# Patient Record
Sex: Male | Born: 1965 | Hispanic: Yes | Marital: Single | Smoking: Never smoker
Health system: Southern US, Community
[De-identification: ages and names within clinical notes are randomized; demographics above are authoritative.]

## PROBLEM LIST (undated history)

## (undated) DIAGNOSIS — Z9289 Personal history of other medical treatment: Secondary | ICD-10-CM

## (undated) DIAGNOSIS — E7800 Pure hypercholesterolemia, unspecified: Secondary | ICD-10-CM

---

## 1998-10-22 DIAGNOSIS — Z9289 Personal history of other medical treatment: Secondary | ICD-10-CM

## 1998-10-22 HISTORY — DX: Personal history of other medical treatment: Z92.89

## 2016-01-01 ENCOUNTER — Emergency Department
Admission: EM | Admit: 2016-01-01 | Discharge: 2016-01-01 | Disposition: A | Discharge status: Home / Self Care | Payer: Self-pay | Attending: Emergency Medicine | Admitting: Emergency Medicine

## 2016-01-01 DIAGNOSIS — B029 Zoster without complications: Secondary | ICD-10-CM | POA: Insufficient documentation

## 2016-01-01 MED ORDER — TRAMADOL HCL 50 MG PO TABS: 50.0000 mg | ORAL_TABLET | Freq: Four times a day (QID) | ORAL | Status: AC | PRN

## 2016-01-01 MED ORDER — HYDROXYZINE HCL 50 MG PO TABS: 50.0000 mg | ORAL_TABLET | Freq: Three times a day (TID) | ORAL | Status: DC | PRN

## 2016-01-01 MED ORDER — ACYCLOVIR 400 MG PO TABS: 400.0000 mg | ORAL_TABLET | Freq: Every day | ORAL | Status: DC

## 2016-01-01 NOTE — ED Provider Notes (Signed)
[facility] Emergency Department Provider Note  ____________________________________________  Time seen: Approximately 5:12 PM  I have reviewed the triage vital signs and the nursing notes.   HISTORY  Chief Complaint Rash  Via interpreter  HPI Rosana Fretndres Claros is a 50 y.o. male patient complaining a rash processes to the right abdomen and right flank. Patient state onset 2 days. Patient describes the pain as a burning sensation. Patient states some of the blisters had person crusted over. Patient states the past 2 weeks he has had fever bodyaches. Patient states she's had diarrhea and nausea but no vomiting. 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 No palliative measures for these complaints. Patient does not recall childhood immunizations . History reviewed. No pertinent past medical history.  There are no active problems to display for this patient.   History reviewed. No pertinent past surgical history.  Current Outpatient Rx  Name  Route  Sig  Dispense  Refill  . acyclovir (ZOVIRAX) 400 MG tablet   Oral   Take 1 tablet (400 mg total) by mouth 5 (five) times daily.   50 tablet   0   . hydrOXYzine (ATARAX/VISTARIL) 50 MG tablet   Oral   Take 1 tablet (50 mg total) by mouth 3 (three) times daily as needed.   30 tablet   0   . traMADol (ULTRAM) 50 MG tablet   Oral   Take 1 tablet (50 mg total) by mouth every 6 (six) hours as needed.   20 tablet   0     Allergies Review of patient's allergies indicates no known allergies.  No family history on file.  Social History Social History  Substance Use Topics  . Smoking status: Never Smoker   . Smokeless tobacco: None  . Alcohol Use: No    Review of Systems Constitutional: No fever/chills Eyes: No visual changes. ENT: No sore throat. Cardiovascular: Denies chest pain. Respiratory: Denies shortness of breath. Gastrointestinal: No abdominal pain.  No nausea, no vomiting.  No diarrhea.  No  constipation. Genitourinary: Negative for dysuria. Musculoskeletal: Negative for back pain. Skin: Positive for rash. Neurological: Negative for headaches, focal weakness or numbness.   ____________________________________________   PHYSICAL EXAM:  VITAL SIGNS: ED Triage Vitals  Enc Vitals Group     BP 01/01/16 1556 138/82 mmHg     Pulse Rate 01/01/16 1556 65     Resp 01/01/16 1556 18     Temp 01/01/16 1556 98.1 F (36.7 C)     Temp Source 01/01/16 1556 Oral     SpO2 01/01/16 1556 100 %     Weight 01/01/16 1556 160 lb (72.576 kg)     Height 01/01/16 1556 5\' 8"  (1.727 m)     Head Cir --      Peak Flow --      Pain Score 01/01/16 1556 8     Pain Loc --      Pain Edu? --      Excl. in GC? --     Constitutional: Alert and oriented. Well appearing and in no acute distress. Eyes: Conjunctivae are normal. PERRL. EOMI. Head: Atraumatic. Nose: No congestion/rhinnorhea. Mouth/Throat: Mucous membranes are moist.  Oropharynx non-erythematous. Neck: No stridor.  No cervical spine tenderness to palpation. Hematological/Lymphatic/Immunilogical: No cervical lymphadenopathy. Cardiovascular: Normal rate, regular rhythm. Grossly normal heart sounds.  Good peripheral circulation. Respiratory: Normal respiratory effort.  No retractions. Lungs CTAB. Gastrointestinal: Soft and nontender. No distention. No abdominal bruits. No CVA tenderness. Musculoskeletal: No lower  extremity tenderness nor edema.  No joint effusions. Neurologic:  Normal speech and language. No gross focal neurologic deficits are appreciated. No gait instability. Skin:  Skin is warm, dry and intact. Vesicle lesions right lateral abdomen and flank area.  Psychiatric: Mood and affect are normal. Speech and behavior are normal.  ____________________________________________   LABS (all labs ordered are listed, but only abnormal results are displayed)  Labs Reviewed - No data to  display ____________________________________________  EKG   ____________________________________________  RADIOLOGY   ____________________________________________   PROCEDURES  Procedure(s) performed: None  Critical Care performed: No  ____________________________________________   INITIAL IMPRESSION / ASSESSMENT AND PLAN / ED COURSE  Pertinent labs & imaging results that were available during my care of the patient were reviewed by me and considered in my medical decision making (see chart for details).  Herpes zoster. Patient given discharge care instructions. He is given a prescription for famciclovir, tramadol, and Atarax. [facility]. ____________________________________________   FINAL CLINICAL IMPRESSION(S) / ED DIAGNOSES  Final diagnoses:  Herpes zoster      Joni Reining, PA-C 01/01/16 1716  Emily Filbert, MD 01/01/16 1721

## 2016-01-01 NOTE — ED Notes (Addendum)
Pt arrives to ER via POV. Interpreter used for triage. Pt c/o rash/ blisters to right abdomen, right arm and right upper back X 2 days, burning in nature. Blisters have burst and crust appears on blisters. Fever and body aches X 2 weeks. Pt alert and oriented X4, active, cooperative, pt in NAD. RR even and unlabored, color WNL.

## 2016-01-01 NOTE — Discharge Instructions (Signed)
Culebrilla °(Shingles) °La culebrilla es una infección que causa una erupción dolorosa en la piel y ampollas llenas de líquido. La culebrilla está causada por el mismo virus que causa la varicela. °Solo se manifiesta en personas que: °· Tuvieron varicela. °· Recibieron la vacuna contra la varicela. (Esto es poco frecuente). °Los primeros síntomas de la culebrilla pueden ser picazón, hormigueo o dolor en una zona de la piel. Luego de unos días o semanas aparecerá una erupción cutánea. La erupción suele presentarse en un solo lado del cuerpo en un patrón con forma de cinto o de banda. Con el tiempo, la erupción se convierte en ampollas llenas de líquido que se abren, forman costras y se secan. Los medicamentos pueden tener los siguientes efectos: °· Ayudar a controlar el dolor. °· Lograr una recuperación más rápida. °· Prevenir problemas a largo plazo. °CUIDADOS EN EL HOGAR °Medicamentos  °· Tome los medicamentos solamente como se lo haya indicado el médico. °· Aplique una crema para calmar la picazón o con anestesia en la zona afectada, como se lo haya indicado el médico. °Cuidado de la erupción y las ampollas  °· Tome un baño de agua fría o aplique compresas frías en la zona de la erupción o las ampollas como se lo haya indicado el médico. Esto aliviará el dolor y la picazón. °· Mantenga la zona de la erupción cubierta con una venda (vendaje). Use ropas sueltas. °· Mantenga limpia la zona de la erupción y las ampollas con jabón suave y agua fría, o como se lo haya indicado el médico. °· Controle la erupción todos los días para detectar signos de infección. Estos signos incluyen enrojecimiento, hinchazón o dolor que perduran o empeoran. °· No pellizque las ampollas. °· No se rasque la zona de la erupción. °Instrucciones generales  °· Haga reposo tal como le indicó el médico. °· Concurra a todas las visitas de control como se lo haya indicado el médico. Esto es importante. °· Hasta tanto las ampollas formen costras,  la infección puede causar varicela en las personas que nunca la tuvieron o no se vacunaron contra la varicela. Para impedir que esto sucede, evite tocar a otras personas o estar cerca de otras personas, en especial: °¨ Bebés. °¨ Embarazadas. °¨ Niños que tienen eccema. °¨ Personas mayores que han recibido un trasplante. °¨ Personas con enfermedades crónicas, como leucemia y sida. °SOLICITE AYUDA SI: °· El dolor no mejora con medicamentos. °· El dolor no mejora después de que la erupción desaparece. °· La erupción parece infectada. Los signos de infección son los siguiente: °¨ Enrojecimiento. °¨ Hinchazón. °¨ Dolor que perdura o empeora. °SOLICITE AYUDA DE INMEDIATO SI: °· La erupción aparece en el rostro o la nariz. °· Tiene dolor en el rostro, en la zona de los ojos o pérdida de la sensibilidad en un lado del rostro. °· Siente dolor o un zumbido en el oído. °· Tiene pérdida del gusto. °· La afección empeora. °  °Esta información no tiene como fin reemplazar el consejo del médico. Asegúrese de hacerle al médico cualquier pregunta que tenga. °  °Document Released: 01/04/2009 Document Revised: 10/29/2014 °Elsevier Interactive Patient Education ©2016 [employer] ° °

## 2016-12-05 ENCOUNTER — Emergency Department
Admission: EM | Admit: 2016-12-05 | Discharge: 2016-12-06 | Disposition: A | Discharge status: Home / Self Care | Payer: Self-pay | Attending: Emergency Medicine | Admitting: Emergency Medicine

## 2016-12-05 ENCOUNTER — Emergency Department: Payer: Self-pay

## 2016-12-05 ENCOUNTER — Encounter: Payer: Self-pay | Admitting: Emergency Medicine

## 2016-12-05 DIAGNOSIS — J189 Pneumonia, unspecified organism: Secondary | ICD-10-CM | POA: Insufficient documentation

## 2016-12-05 DIAGNOSIS — J111 Influenza due to unidentified influenza virus with other respiratory manifestations: Secondary | ICD-10-CM | POA: Insufficient documentation

## 2016-12-05 MED ORDER — AZITHROMYCIN 500 MG PO TABS
500.0000 mg | ORAL_TABLET | Freq: Once | ORAL | Status: AC
  Administered 2016-12-06: 500 mg via ORAL
  Filled 2016-12-05: qty 1

## 2016-12-05 NOTE — ED Notes (Signed)
Patient transported to X-ray 

## 2016-12-05 NOTE — ED Notes (Addendum)
See triage note, pt reports cough, fever at night and chills for the last week. Pt states his cough is productive with yellow sputum. Pt states he has taken ibuprofen and tylenol with little improvement.

## 2016-12-05 NOTE — ED Provider Notes (Signed)
[facility] Emergency Department Provider Note  ____________________________________________  Time seen: Approximately 11:47 PM  I have reviewed the triage vital signs and the nursing notes.   HISTORY  Chief Complaint Fever; Chills; and Cough    HPI Gregg Leon is a 51 y.o. male presenting to the emergency department with productive cough, fever, chills, headache, diminished appetite and fatigue for the past week. Patient states that he has had purulent sputum production. Fever been as high as 104F assessed orally. Patient's children have been diagnosed with influenza last week. Patient denies chest pain, chest tightness, shortness of breath, abdominal pain, nausea and vomiting. Patient has been taking Tylenol and ibuprofen with little improvement.   History reviewed. No pertinent past medical history.  There are no active problems to display for this patient.   History reviewed. No pertinent surgical history.  Prior to Admission medications   Medication Sig Start Date End Date Taking? Authorizing Provider  acyclovir (ZOVIRAX) 400 MG tablet Take 1 tablet (400 mg total) by mouth 5 (five) times daily. 01/01/16   Joni Reining, PA-C  azithromycin (ZITHROMAX Z-PAK) 250 MG tablet Take 1 tablet daily by mouth for the next 4 days. 12/06/16 12/11/16  Orvil Feil, PA-C  hydrOXYzine (ATARAX/VISTARIL) 50 MG tablet Take 1 tablet (50 mg total) by mouth 3 (three) times daily as needed. 01/01/16   Joni Reining, PA-C  traMADol (ULTRAM) 50 MG tablet Take 1 tablet (50 mg total) by mouth every 6 (six) hours as needed. 01/01/16 12/31/16  Joni Reining, PA-C    Allergies Patient has no known allergies.  History reviewed. No pertinent family history.  Social History Social History  Substance Use Topics  . Smoking status: Never Smoker  . Smokeless tobacco: Never Used  . Alcohol use No     Review of Systems  Constitutional: Patient has had fever.  Eyes: No visual  changes. No discharge ENT: Patient has had congestion.  Cardiovascular: no chest pain. Respiratory: Patient has had productive cough.  No SOB. Gastrointestinal: Patient has had nausea.  Genitourinary: Negative for dysuria. No hematuria Musculoskeletal: Patient has had myalgias. Skin: Negative for rash, abrasions, lacerations, ecchymosis. Neurological: Patient has had headache, no focal weakness or numbness.   ____________________________________________   PHYSICAL EXAM:  VITAL SIGNS: ED Triage Vitals [12/05/16 2131]  Enc Vitals Group     BP 122/67     Pulse Rate (!) 58     Resp 15     Temp 98.2 F (36.8 C)     Temp Source Oral     SpO2 100 %     Weight 160 lb (72.6 kg)     Height      Head Circumference      Peak Flow      Pain Score      Pain Loc      Pain Edu?      Excl. in GC?      Constitutional: Alert and oriented. Patient is lying supine in bed.  Eyes: Conjunctivae are normal. PERRL. EOMI. Head: Atraumatic. ENT:      Ears: Tympanic membranes are injected bilaterally without evidence of effusion or purulent exudate. Bony landmarks are visualized bilaterally. No pain with palpation at the tragus.      Nose: Nasal turbinates are edematous and erythematous. Copious rhinorrhea visualized.      Mouth/Throat: Mucous membranes are moist. Posterior pharynx is mildly erythematous. No tonsillar hypertrophy or purulent exudate. Uvula is midline. Neck: Full range of motion.  No pain is elicited with flexion at the neck. Hematological/Lymphatic/Immunilogical: No cervical lymphadenopathy. Cardiovascular: Normal rate, regular rhythm. Normal S1 and S2.  Good peripheral circulation. Respiratory: Normal respiratory effort without tachypnea or retractions. Fine crackles are auscultated at the lung bases bilaterally. Good air entry to the bases with no decreased or absent breath sounds. Gastrointestinal: Bowel sounds 4 quadrants. Soft and nontender to palpation. No guarding or  rigidity. No palpable masses. No distention. No CVA tenderness.  Skin:  Skin is warm, dry and intact. No rash noted. Psychiatric: Mood and affect are normal. Speech and behavior are normal. Patient exhibits appropriate insight and judgement.  ____________________________________________   LABS (all labs ordered are listed, but only abnormal results are displayed)  Labs Reviewed - No data to display ____________________________________________  EKG   ____________________________________________  RADIOLOGY   Dg Chest 2 View  Result Date: 12/05/2016 CLINICAL DATA:  Acute onset of cough.  Initial encounter. EXAM: CHEST  2 VIEW COMPARISON:  None. FINDINGS: The lungs are well-aerated and clear. There is no evidence of focal opacification, pleural effusion or pneumothorax. The heart is normal in size; the mediastinal contour is within normal limits. No acute osseous abnormalities are seen. IMPRESSION: No acute cardiopulmonary process seen. Electronically Signed   By: Roanna RaiderJeffery  Chang M.D.   On: 12/05/2016 23:54    ____________________________________________    PROCEDURES  Procedure(s) performed:    Procedures    Medications  azithromycin (ZITHROMAX) tablet 500 mg (not administered)     ____________________________________________   INITIAL IMPRESSION / ASSESSMENT AND PLAN / ED COURSE  Pertinent labs & imaging results that were available during my care of the patient were reviewed by me and considered in my medical decision making (see chart for details).  Review of the [place] CSRS was performed in accordance of the [facility] prior to dispensing any controlled drugs.     Assessment and Plan:  Influenza: Community Acquired Pneumonia  Patient presents to the emergency department with headache, rhinorrhea, congestion, myalgias, chills, purulent sputum formation and fatigue. Symptoms are consistent with influenza. DG chest conducted in the emergency department indicates no  findings consistent with pneumonia. On physical exam, patient had fine crackles auscultated at the lung bases bilaterally. As patient has had purulent sputum production, I am concerned for early pneumonia. Patient was given azithromycin in the emergency department and discharged with azithromycin. Tamiflu was not prescribed at discharge as it is not therapeutically efficacious given duration of patient's symptoms. Rest and hydration were encouraged. Patient was advised to follow-up with his primary care provider in one week. Physical exam vital signs are reassuring at this time. All patient questions were answered. ____________________________________________  FINAL CLINICAL IMPRESSION(S) / ED DIAGNOSES  Final diagnoses:  Community acquired pneumonia, unspecified laterality  Influenza      NEW MEDICATIONS STARTED DURING THIS VISIT:  New Prescriptions   AZITHROMYCIN (ZITHROMAX Z-PAK) 250 MG TABLET    Take 1 tablet daily by mouth for the next 4 days.        This chart was dictated using voice recognition software/Dragon. Despite best efforts to proofread, errors can occur which can change the meaning. Any change was purely unintentional.    Orvil FeilJaclyn M Keta [place], PA-C 12/06/16 0003    Governor Rooksebecca Lord, MD 12/08/16 [phone]

## 2016-12-05 NOTE — ED Triage Notes (Signed)
Pt ambulatory to triage with steady gait, no distress noted. Pt c/o of cough, fever, chills x 1 week. Denies taking OTC medication for relief.

## 2016-12-06 MED ORDER — AZITHROMYCIN 250 MG PO TABS: ORAL_TABLET | ORAL | 0 refills | Status: AC

## 2017-07-02 ENCOUNTER — Emergency Department: Payer: Self-pay

## 2017-07-02 ENCOUNTER — Other Ambulatory Visit: Payer: Self-pay | Admitting: Radiology

## 2017-07-02 ENCOUNTER — Encounter: Payer: Self-pay | Admitting: *Deleted

## 2017-07-02 ENCOUNTER — Emergency Department
Admission: EM | Admit: 2017-07-02 | Discharge: 2017-07-02 | Disposition: A | Discharge status: Home / Self Care | Payer: Self-pay | Attending: Emergency Medicine | Admitting: Emergency Medicine

## 2017-07-02 DIAGNOSIS — G245 Blepharospasm: Secondary | ICD-10-CM | POA: Insufficient documentation

## 2017-07-02 DIAGNOSIS — R202 Paresthesia of skin: Secondary | ICD-10-CM | POA: Insufficient documentation

## 2017-07-02 DIAGNOSIS — M5412 Radiculopathy, cervical region: Secondary | ICD-10-CM | POA: Insufficient documentation

## 2017-07-02 DIAGNOSIS — M4802 Spinal stenosis, cervical region: Secondary | ICD-10-CM | POA: Insufficient documentation

## 2017-07-02 HISTORY — DX: Personal history of other medical treatment: Z92.89

## 2017-07-02 HISTORY — DX: Pure hypercholesterolemia, unspecified: E78.00

## 2017-07-02 LAB — CBC
HCT: 43.1 % (ref 40.0–52.0)
Hemoglobin: 15.5 g/dL (ref 13.0–18.0)
MCH: 31.9 pg (ref 26.0–34.0)
MCHC: 36 g/dL (ref 32.0–36.0)
MCV: 88.6 fL (ref 80.0–100.0)
PLATELETS: 172 10*3/uL (ref 150–440)
RBC: 4.87 MIL/uL (ref 4.40–5.90)
RDW: 14.2 % (ref 11.5–14.5)
WBC: 6.2 10*3/uL (ref 3.8–10.6)

## 2017-07-02 LAB — BASIC METABOLIC PANEL
Anion gap: 8 (ref 5–15)
BUN: 10 mg/dL (ref 6–20)
CO2: 26 mmol/L (ref 22–32)
CREATININE: 0.76 mg/dL (ref 0.61–1.24)
Calcium: 8.7 mg/dL — ABNORMAL LOW (ref 8.9–10.3)
Chloride: 103 mmol/L (ref 101–111)
GFR calc Af Amer: >60 mL/min (ref >60)
GLUCOSE: 123 mg/dL — AB (ref 65–99)
Potassium: 4 mmol/L (ref 3.5–5.1)
SODIUM: 137 mmol/L (ref 135–145)

## 2017-07-02 LAB — TROPONIN I: Troponin I: <0.03 ng/mL (ref <0.03)

## 2017-07-02 MED ORDER — CYCLOBENZAPRINE HCL 5 MG PO TABS: 5.0000 mg | ORAL_TABLET | Freq: Three times a day (TID) | ORAL | 0 refills | Status: DC | PRN

## 2017-07-02 MED ORDER — DEXAMETHASONE SODIUM PHOSPHATE 10 MG/ML IJ SOLN: 10.0000 mg | Freq: Once | INTRAMUSCULAR | Status: DC

## 2017-07-02 MED ORDER — PREDNISONE 20 MG PO TABS: 60.0000 mg | ORAL_TABLET | Freq: Every day | ORAL | 0 refills | Status: DC

## 2017-07-02 MED ORDER — DEXAMETHASONE SODIUM PHOSPHATE 10 MG/ML IJ SOLN
10.0000 mg | Freq: Once | INTRAMUSCULAR | Status: AC
  Administered 2017-07-02: 10 mg

## 2017-07-02 MED ORDER — DEXAMETHASONE 1 MG/ML PO CONC
10.0000 mg | Freq: Once | ORAL | Status: DC
  Filled 2017-07-02: qty 10

## 2017-07-02 MED ORDER — DEXAMETHASONE SODIUM PHOSPHATE 10 MG/ML IJ SOLN
INTRAMUSCULAR | Status: AC
  Filled 2017-07-02: qty 1

## 2017-07-02 NOTE — ED Notes (Signed)
Water given to pt 

## 2017-07-02 NOTE — ED Notes (Signed)
Spoke with MRI staff.  They report it will be about 1 more hour until they will be able to get to him.

## 2017-07-02 NOTE — ED Notes (Signed)
Monitors removed and pt undressed completely and placed in hospital gown; pt to MRI via stretcher accomp by MRI tech and EDT, Rayford HalstedJuanetta

## 2017-07-02 NOTE — ED Triage Notes (Signed)
Pt to ED reporting chest pains and weakness for the past 2 days that worsened last night and began radiating into left side of neck. Pt also reports having numbness in left arm for the past 2 days that worsened today. Pt reports feeling a tingling and numbness "all over." No neuro deficits noted at this time and no increased weakness noted to left side upon assessment. Pt is alert and oriented x 4 and denies heart hx.   Pt also reports having intermittent blurred vision in left eye yesterday but denies currently having changes in vision.

## 2017-07-02 NOTE — ED Notes (Signed)
Pt returns to room; no c/o voiced

## 2017-07-02 NOTE — ED Provider Notes (Addendum)
Marland Kitchen.[facility] Emergency Department Provider Note  ____________________________________________   I have reviewed the triage vital signs and the nursing notes.   HISTORY  Chief Complaint Chest Pain and Numbness    HPI Gregg Leon is a 51 y.o. male Presents today complaining of tingling in his arm and leg and neck pain which goes up towards his ear and down his trapezius muscle for 2 weeks. Worse when he sleeps. To me, he denies chest pain. He has no exertional symptoms he has no shortness of breath. He denies weakness but he does have progressive tingling. He does not recall any recent trauma to his head or neck, he denies any right-sided symptoms. He has had no change in vision but sometimes he has a twitch around his eye sounds like. Patient denies any fever or chills. Has had normal bowel movements. He is speaking clearly, he has no trouble talking or walking. Most of the time this irritates and when he tried to sleep.    Past Medical History:  Diagnosis Date  . High cholesterol   . History of blood transfusion 2000    There are no active problems to display for this patient.   History reviewed. No pertinent surgical history.  Prior to Admission medications   Medication Sig Start Date End Date Taking? Authorizing Provider  acyclovir (ZOVIRAX) 400 MG tablet Take 1 tablet (400 mg total) by mouth 5 (five) times daily. Patient not taking: Reported on 07/02/2017 01/01/16   Joni ReiningSmith, Ronald K, PA-C  hydrOXYzine (ATARAX/VISTARIL) 50 MG tablet Take 1 tablet (50 mg total) by mouth 3 (three) times daily as needed. Patient not taking: Reported on 07/02/2017 01/01/16   Joni ReiningSmith, Ronald K, PA-C    Allergies Patient has no known allergies.  History reviewed. No pertinent family history.  Social History Social History  Substance Use Topics  . Smoking status: Never Smoker  . Smokeless tobacco: Never Used  . Alcohol use No    Review of  Systems Constitutional: No fever/chills Eyes: No visual changes. ENT: No sore throat. No stiff neck no neck pain Cardiovascular: Denies chest pain. Respiratory: Denies shortness of breath. Gastrointestinal:   no vomiting.  No diarrhea.  No constipation. Genitourinary: Negative for dysuria. Musculoskeletal: Negative lower extremity swelling Skin: Negative for rash. Neurological: Negative for severe headaches, focal weakness or numbness.   ____________________________________________   PHYSICAL EXAM:  VITAL SIGNS: ED Triage Vitals  Enc Vitals Group     BP 07/02/17 1356 (!) 142/78     Pulse Rate 07/02/17 1356 64     Resp 07/02/17 1356 16     Temp 07/02/17 1356 98 F (36.7 C)     Temp Source 07/02/17 1356 Oral     SpO2 07/02/17 1356 99 %     Weight 07/02/17 1328 162 lb (73.5 kg)     Height --      Head Circumference --      Peak Flow --      Pain Score 07/02/17 1327 8     Pain Loc --      Pain Edu? --      Excl. in GC? --     Constitutional: Alert and oriented. Well appearing and in no acute distress. Eyes: Conjunctivae are normal Head: Atraumatic HEENT: No congestion/rhinnorhea. Mucous membranes are moist.  Oropharynx non-erythematous Neck:   Nontender no centralized tenderness however there is tenderness to palpation over the trapezius muscle when I squeeze this area patient states "ouch that's the pain  right there Cardiovascular: Normal rate, regular rhythm. Grossly normal heart sounds.  Good peripheral circulation. Respiratory: Normal respiratory effort.  No retractions. Lungs CTAB. Abdominal: Soft and nontender. No distention. No guarding no rebound Back:  There is no focal tenderness or step off.  there is no midline tenderness there are no lesions noted. there is no CVA tenderness Musculoskeletal: No lower extremity tenderness, no upper extremity tenderness. No joint effusions, no DVT signs strong distal pulses no edema Neurologic:  Normal speech and language. No  gross focal neurologic deficits are appreciated. patient has subjective tingling to the left upper and left lower shortly but sensation is intact otherwise completely normal neurologic exam,Cranial nerves II through XII are grossly intact 5 out of 5 strength bilateral upper and lower extremity. Finger to nose within normal limits heel to shin within normal limits, speech is normal with no word finding difficulty or dysarthria, reflexes symmetric, pupils are equally round and reactive to light, there is no pronator drift, sensation is normal, vision is intact to confrontation, gait is deferred, there is no nystagmus, normal neurologic exam Skin:  Skin is warm, dry and intact. No rash noted. Psychiatric: Mood and affect are normal. Speech and behavior are normal.  ____________________________________________   LABS (all labs ordered are listed, but only abnormal results are displayed)  Labs Reviewed  BASIC METABOLIC PANEL - Abnormal; Notable for the following:       Result Value   Glucose, Bld 123 (*)    Calcium 8.7 (*)    All other components within normal limits  CBC  TROPONIN I   ____________________________________________  EKG  I personally interpreted any EKGs ordered by me or triage normal sinus rhythm, rate 61 beats minute no acute ischemic changes unremarkable EKG ____________________________________________  RADIOLOGY  I reviewed any imaging ordered by me or triage that were performed during my shift and, if possible, patient and/or family made aware of any abnormal findings. ____________________________________________   PROCEDURES  Procedure(s) performed: None  Procedures  Critical Care performed: None  ____________________________________________   INITIAL IMPRESSION / ASSESSMENT AND PLAN / ED COURSE  Pertinent labs & imaging results that were available during my care of the patient were reviewed by me and considered in my medical decision making (see chart for  details).  SHEENT here with tingling in his left upper and left lower extremity is worse at night, which is progressive as well as neck pain. Concern for radiculopathy of the cervical region is most likely. I will get imaging to verify no other acute pathology, neurologically he is intact with likely has poor follow-up. Certainly at December low suspicion for CVA by given the distribution of symptoms we'll also obtain an MRI of his head and I will continue to evaluate him. Very low suspicion for ACS PE or dissection.  ----------------------------------------- 10:04 PM on 07/02/2017 -----------------------------------------  extensively explained all of her findings with the help of a Spanish interpreter and my Spanish. Patient understands return precautions and follow-up, he also understands the need to look out for signs and symptoms of cauda equina room etc., and this dictation, he is informed that he must not drive on [place] etc. Patient understands all these instructions and all of his questions were answered.   ____________________________________________   FINAL CLINICAL IMPRESSION(S) / ED DIAGNOSES  Final diagnoses:  None      This chart was dictated using voice recognition software.  Despite best efforts to proofread,  errors can occur which can change meaning.  Jeanmarie Plant, MD 07/02/17 1646    Jeanmarie Plant, MD 07/02/17 2205

## 2017-07-02 NOTE — ED Notes (Addendum)
MRI tech at bedside to review procedure and checklist [facility], [place]; pt voices good understanding of procedure to be performed; pt denies any c/o at present with no CP; pt voices understanding of repeat troponin to be performed as well

## 2017-07-02 NOTE — ED Notes (Signed)
interpreter arrived to triage

## 2020-08-27 ENCOUNTER — Emergency Department: Payer: Medicaid Other

## 2020-08-27 ENCOUNTER — Inpatient Hospital Stay
Admission: EM | Admit: 2020-08-27 | Discharge: 2020-08-31 | DRG: 177 | Disposition: A | Discharge status: Home / Self Care | Payer: Medicaid Other | Attending: Hospitalist | Admitting: Hospitalist

## 2020-08-27 DIAGNOSIS — R0602 Shortness of breath: Secondary | ICD-10-CM | POA: Diagnosis present

## 2020-08-27 DIAGNOSIS — R748 Abnormal levels of other serum enzymes: Secondary | ICD-10-CM | POA: Diagnosis not present

## 2020-08-27 DIAGNOSIS — T380X5A Adverse effect of glucocorticoids and synthetic analogues, initial encounter: Secondary | ICD-10-CM | POA: Diagnosis not present

## 2020-08-27 DIAGNOSIS — K219 Gastro-esophageal reflux disease without esophagitis: Secondary | ICD-10-CM | POA: Diagnosis present

## 2020-08-27 DIAGNOSIS — Z79899 Other long term (current) drug therapy: Secondary | ICD-10-CM | POA: Diagnosis not present

## 2020-08-27 DIAGNOSIS — D6959 Other secondary thrombocytopenia: Secondary | ICD-10-CM | POA: Diagnosis present

## 2020-08-27 DIAGNOSIS — E876 Hypokalemia: Secondary | ICD-10-CM | POA: Diagnosis present

## 2020-08-27 DIAGNOSIS — E785 Hyperlipidemia, unspecified: Secondary | ICD-10-CM | POA: Diagnosis present

## 2020-08-27 DIAGNOSIS — E7800 Pure hypercholesterolemia, unspecified: Secondary | ICD-10-CM | POA: Diagnosis present

## 2020-08-27 DIAGNOSIS — D696 Thrombocytopenia, unspecified: Secondary | ICD-10-CM

## 2020-08-27 DIAGNOSIS — D72818 Other decreased white blood cell count: Secondary | ICD-10-CM | POA: Diagnosis not present

## 2020-08-27 DIAGNOSIS — J1282 Pneumonia due to coronavirus disease 2019: Secondary | ICD-10-CM | POA: Diagnosis not present

## 2020-08-27 DIAGNOSIS — R0902 Hypoxemia: Secondary | ICD-10-CM

## 2020-08-27 DIAGNOSIS — U071 COVID-19: Principal | ICD-10-CM | POA: Diagnosis present

## 2020-08-27 DIAGNOSIS — Y929 Unspecified place or not applicable: Secondary | ICD-10-CM | POA: Diagnosis not present

## 2020-08-27 DIAGNOSIS — R739 Hyperglycemia, unspecified: Secondary | ICD-10-CM | POA: Diagnosis not present

## 2020-08-27 DIAGNOSIS — J9601 Acute respiratory failure with hypoxia: Secondary | ICD-10-CM | POA: Diagnosis not present

## 2020-08-27 DIAGNOSIS — R7303 Prediabetes: Secondary | ICD-10-CM | POA: Diagnosis present

## 2020-08-27 DIAGNOSIS — J9811 Atelectasis: Secondary | ICD-10-CM | POA: Diagnosis present

## 2020-08-27 DIAGNOSIS — D72819 Decreased white blood cell count, unspecified: Secondary | ICD-10-CM

## 2020-08-27 DIAGNOSIS — T50905A Adverse effect of unspecified drugs, medicaments and biological substances, initial encounter: Secondary | ICD-10-CM

## 2020-08-27 LAB — RESPIRATORY PANEL BY RT PCR (FLU A&B, COVID)
Influenza A by PCR: NEGATIVE
Influenza B by PCR: NEGATIVE
SARS Coronavirus 2 by RT PCR: POSITIVE — AB

## 2020-08-27 LAB — COMPREHENSIVE METABOLIC PANEL
ALT: 122 U/L — ABNORMAL HIGH (ref 0–44)
AST: 121 U/L — ABNORMAL HIGH (ref 15–41)
Albumin: 3.5 g/dL (ref 3.5–5.0)
Alkaline Phosphatase: 151 U/L — ABNORMAL HIGH (ref 38–126)
Anion gap: 12 (ref 5–15)
BUN: 13 mg/dL (ref 6–20)
CO2: 24 mmol/L (ref 22–32)
Calcium: 7.9 mg/dL — ABNORMAL LOW (ref 8.9–10.3)
Chloride: 100 mmol/L (ref 98–111)
Creatinine, Ser: 0.92 mg/dL (ref 0.61–1.24)
GFR, Estimated: >60 mL/min (ref >60)
Glucose, Bld: 196 mg/dL — ABNORMAL HIGH (ref 70–99)
Potassium: 3.4 mmol/L — ABNORMAL LOW (ref 3.5–5.1)
Sodium: 136 mmol/L (ref 135–145)
Total Bilirubin: 1 mg/dL (ref 0.3–1.2)
Total Protein: 7.4 g/dL (ref 6.5–8.1)

## 2020-08-27 LAB — CBC WITH DIFFERENTIAL/PLATELET
Abs Immature Granulocytes: 0.03 10*3/uL (ref 0.00–0.07)
Basophils Absolute: 0 10*3/uL (ref 0.0–0.1)
Basophils Relative: 0 %
Eosinophils Absolute: 0 10*3/uL (ref 0.0–0.5)
Eosinophils Relative: 0 %
HCT: 43.3 % (ref 39.0–52.0)
Hemoglobin: 14.9 g/dL (ref 13.0–17.0)
Immature Granulocytes: 1 %
Lymphocytes Relative: 15 %
Lymphs Abs: 0.8 10*3/uL (ref 0.7–4.0)
MCH: 30.2 pg (ref 26.0–34.0)
MCHC: 34.4 g/dL (ref 30.0–36.0)
MCV: 87.8 fL (ref 80.0–100.0)
Monocytes Absolute: 0.3 10*3/uL (ref 0.1–1.0)
Monocytes Relative: 6 %
Neutro Abs: 4.2 10*3/uL (ref 1.7–7.7)
Neutrophils Relative %: 78 %
Platelets: 153 10*3/uL (ref 150–400)
RBC: 4.93 MIL/uL (ref 4.22–5.81)
RDW: 13.2 % (ref 11.5–15.5)
WBC: 5.4 10*3/uL (ref 4.0–10.5)
nRBC: 0 % (ref 0.0–0.2)

## 2020-08-27 LAB — LACTIC ACID, PLASMA: Lactic Acid, Venous: 1.6 mmol/L (ref 0.5–1.9)

## 2020-08-27 LAB — TROPONIN I (HIGH SENSITIVITY): Troponin I (High Sensitivity): 17 ng/L (ref <18)

## 2020-08-27 MED ORDER — ZINC SULFATE 220 (50 ZN) MG PO CAPS
220.0000 mg | ORAL_CAPSULE | Freq: Every day | ORAL | Status: DC
  Administered 2020-08-28 – 2020-08-31 (×4): 220 mg via ORAL
  Filled 2020-08-27 (×4): qty 1

## 2020-08-27 MED ORDER — ONDANSETRON HCL 4 MG PO TABS: 4.0000 mg | ORAL_TABLET | Freq: Four times a day (QID) | ORAL | Status: DC | PRN

## 2020-08-27 MED ORDER — ENOXAPARIN SODIUM 40 MG/0.4ML ~~LOC~~ SOLN
40.0000 mg | SUBCUTANEOUS | Status: DC
  Administered 2020-08-28 – 2020-08-29 (×2): 40 mg via SUBCUTANEOUS
  Filled 2020-08-27 (×2): qty 0.4

## 2020-08-27 MED ORDER — ASCORBIC ACID 500 MG PO TABS
500.0000 mg | ORAL_TABLET | Freq: Every day | ORAL | Status: DC
  Administered 2020-08-28 – 2020-08-31 (×4): 500 mg via ORAL
  Filled 2020-08-27 (×4): qty 1

## 2020-08-27 MED ORDER — GUAIFENESIN-DM 100-10 MG/5ML PO SYRP: 10.0000 mL | ORAL_SOLUTION | ORAL | Status: DC | PRN

## 2020-08-27 MED ORDER — FAMOTIDINE 20 MG PO TABS
20.0000 mg | ORAL_TABLET | Freq: Two times a day (BID) | ORAL | Status: DC
  Administered 2020-08-28 – 2020-08-31 (×7): 20 mg via ORAL
  Filled 2020-08-27 (×7): qty 1

## 2020-08-27 MED ORDER — LACTATED RINGERS IV BOLUS
1000.0000 mL | Freq: Once | INTRAVENOUS | Status: AC
  Administered 2020-08-27: 1000 mL via INTRAVENOUS

## 2020-08-27 MED ORDER — ASPIRIN EC 81 MG PO TBEC
81.0000 mg | DELAYED_RELEASE_TABLET | Freq: Every day | ORAL | Status: DC
  Administered 2020-08-28 – 2020-08-31 (×4): 81 mg via ORAL
  Filled 2020-08-27 (×4): qty 1

## 2020-08-27 MED ORDER — TRAZODONE HCL 50 MG PO TABS
25.0000 mg | ORAL_TABLET | Freq: Every evening | ORAL | Status: DC | PRN
  Administered 2020-08-28: 25 mg via ORAL
  Filled 2020-08-27: qty 1

## 2020-08-27 MED ORDER — MAGNESIUM HYDROXIDE 400 MG/5ML PO SUSP
30.0000 mL | Freq: Every day | ORAL | Status: DC | PRN
  Filled 2020-08-27: qty 30

## 2020-08-27 MED ORDER — ONDANSETRON HCL 4 MG/2ML IJ SOLN: 4.0000 mg | Freq: Four times a day (QID) | INTRAMUSCULAR | Status: DC | PRN

## 2020-08-27 MED ORDER — ACETAMINOPHEN 325 MG PO TABS: 650.0000 mg | ORAL_TABLET | Freq: Four times a day (QID) | ORAL | Status: DC | PRN

## 2020-08-27 MED ORDER — PREDNISONE 20 MG PO TABS
50.0000 mg | ORAL_TABLET | Freq: Every day | ORAL | Status: DC
  Administered 2020-08-31: 09:00:00 50 mg via ORAL
  Filled 2020-08-27: qty 1

## 2020-08-27 MED ORDER — SODIUM CHLORIDE 0.9 % IV SOLN
200.0000 mg | Freq: Once | INTRAVENOUS | Status: AC
  Administered 2020-08-28: 02:00:00 200 mg via INTRAVENOUS
  Filled 2020-08-27: qty 200

## 2020-08-27 MED ORDER — DEXAMETHASONE SODIUM PHOSPHATE 10 MG/ML IJ SOLN
6.0000 mg | Freq: Once | INTRAMUSCULAR | Status: AC
  Administered 2020-08-27: 6 mg via INTRAVENOUS

## 2020-08-27 MED ORDER — HYDROCOD POLST-CPM POLST ER 10-8 MG/5ML PO SUER
5.0000 mL | Freq: Two times a day (BID) | ORAL | Status: DC | PRN
  Administered 2020-08-29: 5 mL via ORAL
  Filled 2020-08-27: qty 5

## 2020-08-27 MED ORDER — GUAIFENESIN ER 600 MG PO TB12
600.0000 mg | ORAL_TABLET | Freq: Two times a day (BID) | ORAL | Status: DC
  Administered 2020-08-28 – 2020-08-31 (×8): 600 mg via ORAL
  Filled 2020-08-27 (×8): qty 1

## 2020-08-27 MED ORDER — METHYLPREDNISOLONE SODIUM SUCC 125 MG IJ SOLR
1.0000 mg/kg | Freq: Two times a day (BID) | INTRAMUSCULAR | Status: AC
  Administered 2020-08-28 – 2020-08-30 (×6): 78.125 mg via INTRAVENOUS
  Filled 2020-08-27 (×6): qty 2

## 2020-08-27 MED ORDER — VITAMIN D 25 MCG (1000 UNIT) PO TABS
1000.0000 [IU] | ORAL_TABLET | Freq: Every day | ORAL | Status: DC
  Administered 2020-08-28 – 2020-08-31 (×4): 1000 [IU] via ORAL
  Filled 2020-08-27 (×4): qty 1

## 2020-08-27 MED ORDER — SODIUM CHLORIDE 0.9 % IV SOLN: INTRAVENOUS | Status: DC

## 2020-08-27 MED ORDER — ACETAMINOPHEN 500 MG PO TABS
1000.0000 mg | ORAL_TABLET | Freq: Once | ORAL | Status: AC
  Administered 2020-08-27: 1000 mg via ORAL

## 2020-08-27 MED ORDER — SODIUM CHLORIDE 0.9 % IV SOLN
100.0000 mg | Freq: Every day | INTRAVENOUS | Status: AC
  Administered 2020-08-28 – 2020-08-31 (×4): 100 mg via INTRAVENOUS
  Filled 2020-08-27 (×5): qty 20

## 2020-08-27 MED ORDER — BARICITINIB 2 MG PO TABS
4.0000 mg | ORAL_TABLET | Freq: Every day | ORAL | Status: DC
  Administered 2020-08-28 – 2020-08-31 (×4): 4 mg via ORAL
  Filled 2020-08-27 (×4): qty 2

## 2020-08-27 NOTE — ED Notes (Signed)
Date and time results received: 08/27/20 2224 (use smartphrase ".now" to insert current time)  Test: Covid-19 Critical Value: Covid-19 Positive  Name of Provider Notified: Dr. Katrinka Blazing  Orders Received? Or Actions Taken?: provider notified

## 2020-08-27 NOTE — Progress Notes (Signed)
Remdesivir - Pharmacy Brief Note   O:  ALT: 122 CXR:  SpO2: 96 % on 4L [place]    A/P:  Remdesivir 200 mg IVPB once followed by 100 mg IVPB daily x 4 days.   Avraj Lindroth D 08/27/2020 11:13 PM

## 2020-08-27 NOTE — ED Notes (Signed)
Hospital translator used. Pt came in saying  Not feeling well for a week, with nausea, vomiting. Pt states several days without a bowel movement. Pt states fever and body aches. Pt placed on cardiac, bp and pulse ox monitor. Side rails up. Pt on 4lpm via [place] of oxygen.

## 2020-08-27 NOTE — H&P (Addendum)
[place]   PATIENT NAME: Gregg Leon    MR#:  [id number]  DATE OF BIRTH:  02/19/1976  DATE OF ADMISSION:  08/27/2020  PRIMARY CARE PHYSICIAN: Patient, No Pcp Per   REQUESTING/REFERRING PHYSICIAN: Vladimir Crofts, MD CHIEF COMPLAINT:  Shortness of breath.  HISTORY OF PRESENT ILLNESS:  Gregg Leon  is a Hispanic 54 y.o. male with a known history of dyslipidemia, who presented to the emergency room with acute onset of worsening dyspnea as well as mainly dry and occasionally productive cough without wheezing and intermittent fever and chills over the last week.  Admits to myalgia and denies any nausea or vomiting or diarrhea.  No loss of taste or smell.  He does not recall any specific COVID-19 exposure.  He has not been vaccinated for COVID-19.  Upon presentation to the emergency room, she was 103.1 with heart rate of 106 and respirate of 28 and pulse currently 84% on room air that was up to 96% on 4 L of O2 by nasal cannula.  Labs reviewed mild hypokalemia at 3.4 with a blood glucose of 196 and alk phos 131, AST 121 ALT 122 and lactic acid was 1.6.  CBC was unremarkable.  COVID-19 PCR came back positive and influenza antigens were negative.  Blood culture was drawn.  Portable chest ray showed mild increased interstitial markings in both lung bases that may be due to atelectasis and/or infectious etiology as well as pulmonary vascular congestion. EKG showed sinus rhythm rate 99 with LVH.  The patient was given 1 g p.o. Tylenol and 6 mg of IV Decadron as well as 1 L bolus of IV lactated Ringer.  He will be admitted to a medical monitored bed for further evaluation and management  PAST MEDICAL HISTORY:   Past Medical History:  Diagnosis Date  . High cholesterol   . History of blood transfusion 2000    PAST SURGICAL HISTORY:  No past surgical history on file.  He denies any previous surgeries.  SOCIAL HISTORY:   Social History   Tobacco Use  . Smoking status:  Never Smoker  . Smokeless tobacco: Never Used  Substance Use Topics  . Alcohol use: No  No history of tobacco or EtOH abuse or illicit drug use.  FAMILY HISTORY:  He denies any familial diseases.  DRUG ALLERGIES:  No Known Allergies  REVIEW OF SYSTEMS:   ROS As per history of present illness. All pertinent systems were reviewed above. Constitutional, HEENT, cardiovascular, respiratory, GI, GU, musculoskeletal, neuro, psychiatric, endocrine, integumentary and hematologic systems were reviewed and are otherwise negative/unremarkable except for positive findings mentioned above in the HPI.   MEDICATIONS AT HOME:   Prior to Admission medications   Medication Sig Start Date End Date Taking? Authorizing Provider  acyclovir (ZOVIRAX) 400 MG tablet Take 1 tablet (400 mg total) by mouth 5 (five) times daily. Patient not taking: Reported on 07/02/2017 01/01/16   Sable Feil, PA-C  cyclobenzaprine (FLEXERIL) 5 MG tablet Take 1 tablet (5 mg total) by mouth 3 (three) times daily as needed for muscle spasms. 07/02/17   Schuyler Amor, MD  hydrOXYzine (ATARAX/VISTARIL) 50 MG tablet Take 1 tablet (50 mg total) by mouth 3 (three) times daily as needed. Patient not taking: Reported on 07/02/2017 01/01/16   Sable Feil, PA-C  predniSONE (DELTASONE) 20 MG tablet Take 3 tablets (60 mg total) by mouth daily. 07/02/17   Schuyler Amor, MD      VITAL SIGNS:  Blood pressure 126/80, pulse 80, temperature (!) 102.4 F (39.1 C), resp. rate (!) 36, weight 78 kg, SpO2 96 %.  PHYSICAL EXAMINATION:  Physical Exam  GENERAL:  54 y.o.-year-old Hispanic male patient lying in the bed with mild respiratory distress with conversational dyspnea. EYES: Pupils equal, round, reactive to light and accommodation. No scleral icterus. Extraocular muscles intact.  HEENT: Head atraumatic, normocephalic. Oropharynx and nasopharynx clear.  NECK:  Supple, no jugular venous distention. No thyroid enlargement, no  tenderness.  LUNGS: Diminished bibasal breath sounds with bibasal crackles. CARDIOVASCULAR: Regular rate and rhythm, S1, S2 normal. No murmurs, rubs, or gallops.  ABDOMEN: Soft, nondistended, nontender. Bowel sounds present. No organomegaly or mass.  EXTREMITIES: No pedal edema, cyanosis, or clubbing.  NEUROLOGIC: Cranial nerves II through XII are intact. Muscle strength 5/5 in all extremities. Sensation intact. Gait not checked.  PSYCHIATRIC: The patient is alert and oriented x 3.  Normal affect and good eye contact. SKIN: No obvious rash, lesion, or ulcer.   LABORATORY PANEL:   CBC Recent Labs  Lab 08/27/20 2118  WBC 5.4  HGB 14.9  HCT 43.3  PLT 153   ------------------------------------------------------------------------------------------------------------------  Chemistries  Recent Labs  Lab 08/27/20 2118  NA 136  K 3.4*  CL 100  CO2 24  GLUCOSE 196*  BUN 13  CREATININE 0.92  CALCIUM 7.9*  AST 121*  ALT 122*  ALKPHOS 151*  BILITOT 1.0   ------------------------------------------------------------------------------------------------------------------  Cardiac Enzymes No results for input(s): TROPONINI in the last 168 hours. ------------------------------------------------------------------------------------------------------------------  RADIOLOGY:  DG Chest Portable 1 View  Result Date: 08/27/2020 CLINICAL DATA:  Hypoxic EXAM: PORTABLE CHEST 1 VIEW COMPARISON:  None. FINDINGS: The heart size and mediastinal contours are within normal limits. There is prominence the central pulmonary vasculature. Increased interstitial markings are seen at both lung bases. The visualized skeletal structures are unremarkable. IMPRESSION: Pulmonary vascular congestion. Mildly increased interstitial markings of both lung bases which may be due to atelectasis and/or infectious etiology. Electronically Signed   By: Prudencio Pair M.D.   On: 08/27/2020 21:51      IMPRESSION AND PLAN:     1.  Acute hypoxemic respiratory failure secondary to COVID-19. -The patient will be admitted to a medically monitored isolation bed. -O2 protocol will be followed to keep O2 saturation above 93.   2.  Multifocal pneumonia secondary to COVID-19. -The patient will be admitted to an isolation monitored bed with droplet and contact precautions. -Given multifocal pneumonia we will empirically place the patient on IV Rocephin and Zithromax for possible bacterial superinfection only with elevated Procalcitonin. -The patient will be placed on scheduled Mucinex and as needed Tussionex. -We will avoid nebulization as much as we can, give bronchodilator MDI if needed, and with deterioration of oxygenation try to avoid BiPAP/CPAP if possible.    -Will obtain sputum Gram stain culture and sensitivity and follow blood cultures. -O2 protocol will be followed. -We will follow CRP, ferritin, LDH and D-dimer. -Will follow manual differential for ANC/ALC ratio as well as follow troponin I and daily CBC with manual differential and CMP. - Will place the patient on IV Remdesivir and IV steroid therapy with IV Solu-Medrol with elevated inflammatory markers. -The patient will be placed on vitamin D3, vitamin C, zinc sulfate, p.o. Pepcid and aspirin. -I discussed Baricitinib and the patient agreed to proceed with it.  3.  Hypokalemia. Potassium will be replaced and magnesium level will be checked.  4.  GERD. -PPI therapy will be resumed.  5.  DVT  prophylaxis. -Subcutaneous Lovenox.  All the records are reviewed and case discussed with ED provider. The plan of care was discussed in details with the patient (and family). I answered all questions. The patient agreed to proceed with the above mentioned plan. Further management will depend upon hospital course.   CODE STATUS: Full code  Status is: Inpatient  Remains inpatient appropriate because:Ongoing diagnostic testing needed not appropriate for  outpatient work up, Unsafe d/c plan, IV treatments appropriate due to intensity of illness or inability to take PO and Inpatient level of care appropriate due to severity of illness   Dispo: The patient is from: Home              Anticipated d/c is to: Home              Anticipated d/c date is: 3 days              Patient currently is not medically stable to d/c.   TOTAL TIME TAKING CARE OF THIS PATIENT: 60 minutes.    Christel Mormon M.D on 08/27/2020 at 11:01 PM  [facility]   From 7 PM-7 AM, contact night-coverage [email or web address]  CC: Primary care physician; Patient, No Pcp Per

## 2020-08-27 NOTE — ED Provider Notes (Signed)
[facility] Emergency Department Provider Note ____________________________________________   First MD Initiated Contact with Patient 08/27/20 2106     (approximate)  I have reviewed the triage vital signs and the nursing notes.  HISTORY  Chief Complaint No chief complaint on file.   HPI Gregg Leon is a 54 y.o. Gregg Leon presents to the ED for evaluation of shortness of breath and cough.  Chart review indicates no relevant medical history. Patient reports he is not vaccinated for COVID-19.  Patient reports 5-6 days of progressively worsening shortness of breath, minimally productive cough, myalgias, fever and generalized weakness.  He denies any syncope, falls or trauma.  He reports diffuse myalgias of 6-10 intensity, for which she has not taken any medications.  Reports occasional spiking fevers.  Denies known sick contacts for Covid and denies anyone else sick at home.   Past Medical History:  Diagnosis Date  . High cholesterol   . History of blood transfusion 2000    Patient Active Problem List   Diagnosis Date Noted  . Acute hypoxemic respiratory failure due to COVID-19 [facility]) 08/27/2020    No past surgical history on file.  Prior to Admission medications   Medication Sig Start Date End Date Taking? Authorizing Provider  acyclovir (ZOVIRAX) 400 MG tablet Take 1 tablet (400 mg total) by mouth 5 (five) times daily. Patient not taking: Reported on 07/02/2017 01/01/16   Joni Reining, PA-C  cyclobenzaprine (FLEXERIL) 5 MG tablet Take 1 tablet (5 mg total) by mouth 3 (three) times daily as needed for muscle spasms. Patient not taking: Reported on 08/27/2020 07/02/17   Jeanmarie Plant, MD  hydrOXYzine (ATARAX/VISTARIL) 50 MG tablet Take 1 tablet (50 mg total) by mouth 3 (three) times daily as needed. Patient not taking: Reported on 07/02/2017 01/01/16   Joni Reining, PA-C  predniSONE (DELTASONE) 20 MG tablet Take 3 tablets (60 mg total) by  mouth daily. Patient not taking: Reported on 08/27/2020 07/02/17   Jeanmarie Plant, MD    Allergies Patient has no known allergies.  No family history on file.  Social History Social History   Tobacco Use  . Smoking status: Never Smoker  . Smokeless tobacco: Never Used  Substance Use Topics  . Alcohol use: No  . Drug use: Not on file    Review of Systems  Constitutional: Positive for fever and generalized weakness. Eyes: No visual changes. ENT: No sore throat. Cardiovascular: Denies chest pain. Respiratory: Positive for shortness of breath and cough Gastrointestinal: No abdominal pain.  No nausea, no vomiting.  No diarrhea.  No constipation. Genitourinary: Negative for dysuria. Musculoskeletal: Negative for back pain. Skin: Negative for rash. Neurological: Negative for  focal weakness or numbness.  ____________________________________________   PHYSICAL EXAM:  VITAL SIGNS: Vitals:   08/27/20 2330 08/27/20 2337  BP: 129/81   Pulse: 77 78  Resp: (!) 30 (!) 33  Temp:  100.1 F (37.8 C)  SpO2: 93% 97%      Constitutional: Alert and oriented.  Sitting up in bed and uncomfortable-appearing.  Speaking in full sentences via in person interpreter..  Warm to the touch. Eyes: Conjunctivae are normal. PERRL. EOMI. Head: Atraumatic. Nose: No congestion/rhinnorhea. Mouth/Throat: Mucous membranes are moist.  Oropharynx non-erythematous. Neck: No stridor. No cervical spine tenderness to palpation. Cardiovascular: Normal rate, regular rhythm. Grossly normal heart sounds.  Good peripheral circulation. Respiratory: Tachypneic to about 30.  Clear lungs. Gastrointestinal: Soft , nondistended, nontender to palpation. No CVA tenderness. Musculoskeletal: No lower  extremity tenderness nor edema.  No joint effusions. No signs of acute trauma. Neurologic:  Normal speech and language. No gross focal neurologic deficits are appreciated. No gait instability noted. Skin:  Skin is warm,  dry and intact. No rash noted. Psychiatric: Mood and affect are normal. Speech and behavior are normal.  ____________________________________________   LABS (all labs ordered are listed, but only abnormal results are displayed)  Labs Reviewed  RESPIRATORY PANEL BY RT PCR (FLU A&B, COVID) - Abnormal; Notable for the following components:      Result Value   SARS Coronavirus 2 by RT PCR POSITIVE (*)    All other components within normal limits  COMPREHENSIVE METABOLIC PANEL - Abnormal; Notable for the following components:   Potassium 3.4 (*)    Glucose, Bld 196 (*)    Calcium 7.9 (*)    AST 121 (*)    ALT 122 (*)    Alkaline Phosphatase 151 (*)    All other components within normal limits  CULTURE, BLOOD (SINGLE)  EXPECTORATED SPUTUM ASSESSMENT W REFEX TO RESP CULTURE  CBC WITH DIFFERENTIAL/PLATELET  LACTIC ACID, PLASMA  HIV ANTIBODY (ROUTINE TESTING W REFLEX)  CBC WITH DIFFERENTIAL/PLATELET  COMPREHENSIVE METABOLIC PANEL  C-REACTIVE PROTEIN  FIBRIN DERIVATIVES D-DIMER ([facility] ONLY)  FERRITIN  LACTATE DEHYDROGENASE  FIBRIN DERIVATIVES D-DIMER ([facility] ONLY)  FERRITIN  PROCALCITONIN  BRAIN NATRIURETIC PEPTIDE  C-REACTIVE PROTEIN  TROPONIN I (HIGH SENSITIVITY)  TROPONIN I (HIGH SENSITIVITY)   ____________________________________________  12 Lead EKG  Sinus rhythm, rate of 99 bpm.  Normal axis intervals.  No evidence of acute ischemia.  LVH ____________________________________________  RADIOLOGY  ED MD interpretation: 1 view CXR reviewed by me with patchy multifocal opacities  Official radiology report(s): DG Chest Portable 1 View  Result Date: 08/27/2020 CLINICAL DATA:  Hypoxic EXAM: PORTABLE CHEST 1 VIEW COMPARISON:  None. FINDINGS: The heart size and mediastinal contours are within normal limits. There is prominence the central pulmonary vasculature. Increased interstitial markings are seen at both lung bases. The visualized skeletal structures are unremarkable.  IMPRESSION: Pulmonary vascular congestion. Mildly increased interstitial markings of both lung bases which may be due to atelectasis and/or infectious etiology. Electronically Signed   By: Jonna Clark M.D.   On: 08/27/2020 21:51    ____________________________________________   PROCEDURES and INTERVENTIONS  Procedure(s) performed (including Critical Care):  Procedures  Medications  enoxaparin (LOVENOX) injection 40 mg (has no administration in time range)  0.9 %  sodium chloride infusion (has no administration in time range)  remdesivir 200 mg in sodium chloride 0.9% 250 mL IVPB (has no administration in time range)    Followed by  remdesivir 100 mg in sodium chloride 0.9 % 100 mL IVPB (has no administration in time range)  methylPREDNISolone sodium succinate (SOLU-MEDROL) 125 mg/2 mL injection 78.125 mg (has no administration in time range)    Followed by  predniSONE (DELTASONE) tablet 50 mg (has no administration in time range)  baricitinib (OLUMIANT) tablet 4 mg (has no administration in time range)  guaiFENesin-dextromethorphan (ROBITUSSIN DM) 100-10 MG/5ML syrup 10 mL (has no administration in time range)  chlorpheniramine-HYDROcodone (TUSSIONEX) 10-8 MG/5ML suspension 5 mL (has no administration in time range)  ascorbic acid (VITAMIN C) tablet 500 mg (has no administration in time range)  zinc sulfate capsule 220 mg (has no administration in time range)  famotidine (PEPCID) tablet 20 mg (has no administration in time range)  acetaminophen (TYLENOL) tablet 650 mg (has no administration in time range)  traZODone (DESYREL) tablet 25 mg (  has no administration in time range)  magnesium hydroxide (MILK OF MAGNESIA) suspension 30 mL (has no administration in time range)  ondansetron (ZOFRAN) tablet 4 mg (has no administration in time range)    Or  ondansetron (ZOFRAN) injection 4 mg (has no administration in time range)  aspirin EC tablet 81 mg (has no administration in time range)    guaiFENesin (MUCINEX) 12 hr tablet 600 mg (has no administration in time range)  cholecalciferol (VITAMIN D3) tablet 1,000 Units (has no administration in time range)  dexamethasone (DECADRON) injection 6 mg (6 mg Intravenous Given 08/27/20 2143)  acetaminophen (TYLENOL) tablet 1,000 mg (1,000 mg Oral Given 08/27/20 2142)  lactated ringers bolus 1,000 mL (0 mLs Intravenous Stopped 08/27/20 2234)    ____________________________________________   MDM / ED COURSE   Unvaccinated 54 year old male presents to the ED with 5 days of symptoms attributed to COVID-19 requiring medical admission.  Patient hypoxic on room air requiring nasal cannula, otherwise hemodynamically stable.  Is no evidence of additional pathology or superimposed bacterial pneumonia to require antibiotics.  Decadron provided due to severity of illness and hypoxia.  Admit to hospitalist medicine for further work-up and management.      ____________________________________________   FINAL CLINICAL IMPRESSION(S) / ED DIAGNOSES  Final diagnoses:  COVID-19  Shortness of breath  Hypoxia     ED Discharge Orders    None       Toia Micale   Note:  This document was prepared using Dragon voice recognition software and may include unintentional dictation errors.   [place]ton [place], MD 08/28/20 0000

## 2020-08-27 NOTE — ED Triage Notes (Signed)
Info obtained via [facility] interpreter [place].  Reports cough, fever and chills.

## 2020-08-28 ENCOUNTER — Other Ambulatory Visit: Payer: Self-pay

## 2020-08-28 ENCOUNTER — Encounter: Payer: Self-pay | Admitting: Family Medicine

## 2020-08-28 DIAGNOSIS — D72819 Decreased white blood cell count, unspecified: Secondary | ICD-10-CM

## 2020-08-28 DIAGNOSIS — R739 Hyperglycemia, unspecified: Secondary | ICD-10-CM | POA: Diagnosis not present

## 2020-08-28 DIAGNOSIS — D696 Thrombocytopenia, unspecified: Secondary | ICD-10-CM | POA: Diagnosis not present

## 2020-08-28 DIAGNOSIS — R748 Abnormal levels of other serum enzymes: Secondary | ICD-10-CM

## 2020-08-28 DIAGNOSIS — U071 COVID-19: Secondary | ICD-10-CM | POA: Diagnosis not present

## 2020-08-28 DIAGNOSIS — T50905A Adverse effect of unspecified drugs, medicaments and biological substances, initial encounter: Secondary | ICD-10-CM

## 2020-08-28 DIAGNOSIS — J1282 Pneumonia due to coronavirus disease 2019: Secondary | ICD-10-CM

## 2020-08-28 LAB — CBC WITH DIFFERENTIAL/PLATELET
Abs Immature Granulocytes: 0.02 10*3/uL (ref 0.00–0.07)
Basophils Absolute: 0 10*3/uL (ref 0.0–0.1)
Basophils Relative: 0 %
Eosinophils Absolute: 0 10*3/uL (ref 0.0–0.5)
Eosinophils Relative: 0 %
HCT: 39.4 % (ref 39.0–52.0)
Hemoglobin: 13.6 g/dL (ref 13.0–17.0)
Immature Granulocytes: 1 %
Lymphocytes Relative: 24 %
Lymphs Abs: 0.7 10*3/uL (ref 0.7–4.0)
MCH: 30 pg (ref 26.0–34.0)
MCHC: 34.5 g/dL (ref 30.0–36.0)
MCV: 87 fL (ref 80.0–100.0)
Monocytes Absolute: 0.1 10*3/uL (ref 0.1–1.0)
Monocytes Relative: 2 %
Neutro Abs: 2.2 10*3/uL (ref 1.7–7.7)
Neutrophils Relative %: 73 %
Platelets: 138 10*3/uL — ABNORMAL LOW (ref 150–400)
RBC: 4.53 MIL/uL (ref 4.22–5.81)
RDW: 13.2 % (ref 11.5–15.5)
Smear Review: NORMAL
WBC: 3 10*3/uL — ABNORMAL LOW (ref 4.0–10.5)
nRBC: 0 % (ref 0.0–0.2)

## 2020-08-28 LAB — COMPREHENSIVE METABOLIC PANEL
ALT: 122 U/L — ABNORMAL HIGH (ref 0–44)
AST: 113 U/L — ABNORMAL HIGH (ref 15–41)
Albumin: 3 g/dL — ABNORMAL LOW (ref 3.5–5.0)
Alkaline Phosphatase: 139 U/L — ABNORMAL HIGH (ref 38–126)
Anion gap: 6 (ref 5–15)
BUN: 15 mg/dL (ref 6–20)
CO2: 28 mmol/L (ref 22–32)
Calcium: 7.6 mg/dL — ABNORMAL LOW (ref 8.9–10.3)
Chloride: 104 mmol/L (ref 98–111)
Creatinine, Ser: 0.74 mg/dL (ref 0.61–1.24)
GFR, Estimated: >60 mL/min (ref >60)
Glucose, Bld: 235 mg/dL — ABNORMAL HIGH (ref 70–99)
Potassium: 3.8 mmol/L (ref 3.5–5.1)
Sodium: 138 mmol/L (ref 135–145)
Total Bilirubin: 0.9 mg/dL (ref 0.3–1.2)
Total Protein: 6.6 g/dL (ref 6.5–8.1)

## 2020-08-28 LAB — TROPONIN I (HIGH SENSITIVITY)
Troponin I (High Sensitivity): 17 ng/L (ref <18)
Troponin I (High Sensitivity): 21 ng/L — ABNORMAL HIGH (ref <18)

## 2020-08-28 LAB — FIBRIN DERIVATIVES D-DIMER (ARMC ONLY)
Fibrin derivatives D-dimer (ARMC): 1627.05 ng/mL (FEU) — ABNORMAL HIGH (ref 0.00–499.00)
Fibrin derivatives D-dimer (ARMC): 1736.75 ng/mL (FEU) — ABNORMAL HIGH (ref 0.00–499.00)

## 2020-08-28 LAB — FERRITIN
Ferritin: 1032 ng/mL — ABNORMAL HIGH (ref 24–336)
Ferritin: 1403 ng/mL — ABNORMAL HIGH (ref 24–336)

## 2020-08-28 LAB — C-REACTIVE PROTEIN
CRP: 8.7 mg/dL — ABNORMAL HIGH (ref <1.0)
CRP: 9.6 mg/dL — ABNORMAL HIGH (ref <1.0)

## 2020-08-28 LAB — BRAIN NATRIURETIC PEPTIDE: B Natriuretic Peptide: 29.7 pg/mL (ref 0.0–100.0)

## 2020-08-28 LAB — GLUCOSE, CAPILLARY
Glucose-Capillary: 291 mg/dL — ABNORMAL HIGH (ref 70–99)
Glucose-Capillary: 257 mg/dL — ABNORMAL HIGH (ref 70–99)

## 2020-08-28 LAB — HIV ANTIBODY (ROUTINE TESTING W REFLEX): HIV Screen 4th Generation wRfx: NONREACTIVE

## 2020-08-28 LAB — HEMOGLOBIN A1C
Hgb A1c MFr Bld: 6 % — ABNORMAL HIGH (ref 4.8–5.6)
Mean Plasma Glucose: 125.5 mg/dL

## 2020-08-28 LAB — PROCALCITONIN: Procalcitonin: 0.13 ng/mL

## 2020-08-28 LAB — LACTATE DEHYDROGENASE: LDH: 498 U/L — ABNORMAL HIGH (ref 98–192)

## 2020-08-28 MED ORDER — INFLUENZA VAC SPLIT QUAD 0.5 ML IM SUSY
0.5000 mL | PREFILLED_SYRINGE | INTRAMUSCULAR | Status: DC
  Filled 2020-08-28: qty 0.5

## 2020-08-28 MED ORDER — INSULIN ASPART 100 UNIT/ML ~~LOC~~ SOLN
0.0000 [IU] | Freq: Three times a day (TID) | SUBCUTANEOUS | Status: DC
  Administered 2020-08-28 – 2020-08-29 (×2): 5 [IU] via SUBCUTANEOUS
  Administered 2020-08-29: 2 [IU] via SUBCUTANEOUS
  Administered 2020-08-29: 13:00:00 3 [IU] via SUBCUTANEOUS
  Administered 2020-08-30: 10:00:00 5 [IU] via SUBCUTANEOUS
  Filled 2020-08-28 (×5): qty 1

## 2020-08-28 NOTE — Progress Notes (Signed)
PROGRESS NOTE    Gregg Leon  HAL:[id number] DOB: 02/19/1976 DOA: 08/27/2020 PCP: Patient, No Pcp Per   Chief complaint.  Shortness of breath. Brief Narrative:  Gregg Leon  is a Hispanic 54 y.o. male with a known history of dyslipidemia, who presented to the emergency room with acute onset of worsening dyspnea as well as mainly dry and occasionally productive cough without wheezing and intermittent fever and chills over the last week. Upon presentation to the emergency room, she was 103.1 with heart rate of 106 and respirate of 28 and pulse currently 84% on room air that was up to 96% on 4 L of O2 by nasal cannula. COVID-19 PCR came back positive and influenza antigens were negative.  Blood culture was drawn.  Portable chest ray showed mild increased interstitial markings in both lung bases that may be due to atelectasis and/or infectious etiology as well as pulmonary vascular congestion.  Assessment & Plan:   Active Problems:   Acute hypoxemic respiratory failure due to COVID-19 (HCC)  #1.  Acute hypoxemic respiratory failure secondary to Covid pneumonia. Patient is still on 4 L oxygen today.  Short of breath seem to be improving. Continue oxygen treatment, encourage proning. Continue incentive spirometer.  2.  Multifocal pneumonia secondary to COVID-19. Patient is treated with Solu-Medrol, remdesivir and baricitinib. Continue symptomatic treatment with inhalers cough medicine. Continue prophylaxis. Patient does not have any volume overload. Procalcitonin level 0.13, not consistent with secondary bacterial infection.    3.  Hypokalemia. Recheck level tomorrow.  4.  Liver function changes. Secondary to Covid infection.  Follow.  5.  Leukopenia and thrombocytopenia Secondary to Covid infection.  We will follow closely.  6.  Hyperglycemia. Likely due to steroid use.  Will cover with sliding scale insulin for now.    DVT prophylaxis: Lovenox Code Status:  Full Family Communication: None Disposition Plan:  .   Status is: Inpatient  Remains inpatient appropriate because:Inpatient level of care appropriate due to severity of illness   Dispo: The patient is from: Home              Anticipated d/c is to: Home              Anticipated d/c date is: 3 days              Patient currently is not medically stable to d/c.        I/O last 3 completed shifts: In: 1534.6 [I.V.:244.6; IV Piggyback:1290] Out: -  No intake/output data recorded.     Consultants:   None  Procedures: None  Antimicrobials: None  Subjective: Patient is seen with assistance from virtual Spanish interpreter. Patient states that he feels better today.  Short of breath much improved, he has a cough, nonproductive. No longer has any fever or chills. No abdominal pain nausea vomiting. No dysuria hematuria   Objective: Vitals:   08/27/20 2330 08/27/20 2337 08/28/20 0023 08/28/20 0557  BP: 129/81  125/82 116/79  Pulse: 77 78 66 63  Resp: (!) 30 (!) 33 18 20  Temp:  100.1 F (37.8 C) 99.1 F (37.3 C) (!) 97.5 F (36.4 C)  TempSrc:  Oral Oral   SpO2: 93% 97% 95% 93%  Weight:   75.7 kg   Height:   5\' 7"  (1.702 m)     Intake/Output Summary (Last 24 hours) at 08/28/2020 0919 Last data filed at 08/28/2020 0338 Gross per 24 hour  Intake 1534.6 ml  Output --  Net 1534.6  ml   Filed Weights   08/27/20 2103 08/28/20 0023  Weight: 78 kg 75.7 kg    Examination:  General exam: Appears calm and comfortable  Respiratory system: Clear to auscultation. Respiratory effort normal. Cardiovascular system: S1 & S2 heard, RRR. No JVD, murmurs, rubs, gallops or clicks. No pedal edema. Gastrointestinal system: Abdomen is nondistended, soft and nontender. No organomegaly or masses felt. Normal bowel sounds heard. Central nervous system: Alert and oriented. No focal neurological deficits. Extremities: Symmetric Skin: No rashes, lesions or ulcers Psychiatry:  Mood &  affect appropriate.     Data Reviewed: I have personally reviewed following labs and imaging studies  CBC: Recent Labs  Lab 08/27/20 2118 08/28/20 0441  WBC 5.4 3.0*  NEUTROABS 4.2 2.2  HGB 14.9 13.6  HCT 43.3 39.4  MCV 87.8 87.0  PLT 153 138*   Basic Metabolic Panel: Recent Labs  Lab 08/27/20 2118 08/28/20 0441  NA 136 138  K 3.4* 3.8  CL 100 104  CO2 24 28  GLUCOSE 196* 235*  BUN 13 15  CREATININE 0.92 0.74  CALCIUM 7.9* 7.6*   GFR: Estimated Creatinine Clearance: 112.5 mL/min (by C-G formula based on SCr of 0.74 mg/dL). Liver Function Tests: Recent Labs  Lab 08/27/20 2118 08/28/20 0441  AST 121* 113*  ALT 122* 122*  ALKPHOS 151* 139*  BILITOT 1.0 0.9  PROT 7.4 6.6  ALBUMIN 3.5 3.0*   No results for input(s): LIPASE, AMYLASE in the last 168 hours. No results for input(s): AMMONIA in the last 168 hours. Coagulation Profile: No results for input(s): INR, PROTIME in the last 168 hours. Cardiac Enzymes: No results for input(s): CKTOTAL, CKMB, CKMBINDEX, TROPONINI in the last 168 hours. BNP (last 3 results) No results for input(s): PROBNP in the last 8760 hours. HbA1C: No results for input(s): HGBA1C in the last 72 hours. CBG: No results for input(s): GLUCAP in the last 168 hours. Lipid Profile: No results for input(s): CHOL, HDL, LDLCALC, TRIG, CHOLHDL, LDLDIRECT in the last 72 hours. Thyroid Function Tests: No results for input(s): TSH, T4TOTAL, FREET4, T3FREE, THYROIDAB in the last 72 hours. Anemia Panel: Recent Labs    08/27/20 2333 08/28/20 0441  FERRITIN 1,403* 1,032*   Sepsis Labs: Recent Labs  Lab 08/27/20 2118 08/27/20 2333  PROCALCITON  --  0.13  LATICACIDVEN 1.6  --     Recent Results (from the past 240 hour(s))  Respiratory Panel by RT PCR (Flu A&B, Covid) - Nasopharyngeal Swab     Status: Abnormal   Collection Time: 08/27/20  9:18 PM   Specimen: Nasopharyngeal Swab  Result Value Ref Range Status   SARS Coronavirus 2 by RT  PCR POSITIVE (A) NEGATIVE Final    Comment: RESULT CALLED TO, READ BACK BY AND VERIFIED WITH: ASHTON PETERS AT 2225 ON 08/27/20 BY SS (NOTE) SARS-CoV-2 target nucleic acids are DETECTED.  SARS-CoV-2 RNA is generally detectable in upper respiratory specimens  during the acute phase of infection. Positive results are indicative of the presence of the identified virus, but do not rule out bacterial infection or co-infection with other pathogens not detected by the test. Clinical correlation with patient history and other diagnostic information is necessary to determine patient infection status. The expected result is Negative.  Fact Sheet for Patients:  https://www.moore.com/  Fact Sheet for Healthcare Providers: https://www.young.biz/  This test is not yet approved or cleared by the [place] FDA and  has been authorized for detection and/or diagnosis of SARS-CoV-2 by FDA under an Emergency  Use Authorization (EUA).  This EUA will remain in effect (meaning this test can  be used) for the duration of  the COVID-19 declaration under Section 564(b)(1) of the Act, 21 U.S.C. section 360bbb-3(b)(1), unless the authorization is terminated or revoked sooner.      Influenza A by PCR NEGATIVE NEGATIVE Final   Influenza B by PCR NEGATIVE NEGATIVE Final    Comment: (NOTE) The Xpert Xpress SARS-CoV-2/FLU/RSV assay is intended as an aid in  the diagnosis of influenza from Nasopharyngeal swab specimens and  should not be used as a sole basis for treatment. Nasal washings and  aspirates are unacceptable for Xpert Xpress SARS-CoV-2/FLU/RSV  testing.  Fact Sheet for Patients: https://www.moore.com/  Fact Sheet for Healthcare Providers: https://www.young.biz/  This test is not yet approved or cleared by the [place] FDA and  has been authorized for detection and/or diagnosis of SARS-CoV-2 by  FDA under an  Emergency Use Authorization (EUA). This EUA will remain  in effect (meaning this test can be used) for the duration of the  Covid-19 declaration under Section 564(b)(1) of the Act, 21  U.S.C. section 360bbb-3(b)(1), unless the authorization is  terminated or revoked. Performed at [facility], [address], [place], [place] [address]          Radiology Studies: DG Chest Portable 1 View  Result Date: 08/27/2020 CLINICAL DATA:  Hypoxic EXAM: PORTABLE CHEST 1 VIEW COMPARISON:  None. FINDINGS: The heart size and mediastinal contours are within normal limits. There is prominence the central pulmonary vasculature. Increased interstitial markings are seen at both lung bases. The visualized skeletal structures are unremarkable. IMPRESSION: Pulmonary vascular congestion. Mildly increased interstitial markings of both lung bases which may be due to atelectasis and/or infectious etiology. Electronically Signed   By: Jonna Clark M.D.   On: 08/27/2020 21:51        Scheduled Meds: . vitamin C  500 mg Oral Daily  . aspirin EC  81 mg Oral Daily  . baricitinib  4 mg Oral Daily  . cholecalciferol  1,000 Units Oral Daily  . enoxaparin (LOVENOX) injection  40 mg Subcutaneous Q24H  . famotidine  20 mg Oral BID  . guaiFENesin  600 mg Oral BID  . [START ON 08/29/2020] influenza vac split quadrivalent PF  0.5 mL Intramuscular Tomorrow-1000  . methylPREDNISolone (SOLU-MEDROL) injection  1 mg/kg Intravenous Q12H   Followed by  . [START ON 08/31/2020] predniSONE  50 mg Oral Daily  . zinc sulfate  220 mg Oral Daily   Continuous Infusions: . sodium chloride 100 mL/hr at 08/28/20 0140  . remdesivir 100 mg in NS 100 mL       LOS: 1 day    Time spent: 36 minutes    Marrion Coy, MD [facility]   To contact the attending provider between 7A-7P or the covering provider during after hours 7P-7A, please log into the web site [email or web address] and access using universal [place]  password for that web site. If you do not have the password, please call the hospital operator.  08/28/2020, 9:19 AM

## 2020-08-29 DIAGNOSIS — U071 COVID-19: Secondary | ICD-10-CM | POA: Diagnosis not present

## 2020-08-29 DIAGNOSIS — D696 Thrombocytopenia, unspecified: Secondary | ICD-10-CM | POA: Diagnosis not present

## 2020-08-29 DIAGNOSIS — D72818 Other decreased white blood cell count: Secondary | ICD-10-CM | POA: Diagnosis not present

## 2020-08-29 DIAGNOSIS — R748 Abnormal levels of other serum enzymes: Secondary | ICD-10-CM | POA: Diagnosis not present

## 2020-08-29 LAB — CBC WITH DIFFERENTIAL/PLATELET
Abs Immature Granulocytes: 0.04 10*3/uL (ref 0.00–0.07)
Basophils Absolute: 0 10*3/uL (ref 0.0–0.1)
Basophils Relative: 0 %
Eosinophils Absolute: 0 10*3/uL (ref 0.0–0.5)
Eosinophils Relative: 0 %
HCT: 38.7 % — ABNORMAL LOW (ref 39.0–52.0)
Hemoglobin: 13.5 g/dL (ref 13.0–17.0)
Immature Granulocytes: 1 %
Lymphocytes Relative: 16 %
Lymphs Abs: 0.9 10*3/uL (ref 0.7–4.0)
MCH: 30.8 pg (ref 26.0–34.0)
MCHC: 34.9 g/dL (ref 30.0–36.0)
MCV: 88.2 fL (ref 80.0–100.0)
Monocytes Absolute: 0.4 10*3/uL (ref 0.1–1.0)
Monocytes Relative: 7 %
Neutro Abs: 4.5 10*3/uL (ref 1.7–7.7)
Neutrophils Relative %: 76 %
Platelets: 175 10*3/uL (ref 150–400)
RBC: 4.39 MIL/uL (ref 4.22–5.81)
RDW: 13.4 % (ref 11.5–15.5)
WBC: 5.8 10*3/uL (ref 4.0–10.5)
nRBC: 0 % (ref 0.0–0.2)

## 2020-08-29 LAB — COMPREHENSIVE METABOLIC PANEL
ALT: 169 U/L — ABNORMAL HIGH (ref 0–44)
AST: 140 U/L — ABNORMAL HIGH (ref 15–41)
Albumin: 2.9 g/dL — ABNORMAL LOW (ref 3.5–5.0)
Alkaline Phosphatase: 124 U/L (ref 38–126)
Anion gap: 8 (ref 5–15)
BUN: 25 mg/dL — ABNORMAL HIGH (ref 6–20)
CO2: 25 mmol/L (ref 22–32)
Calcium: 8.2 mg/dL — ABNORMAL LOW (ref 8.9–10.3)
Chloride: 105 mmol/L (ref 98–111)
Creatinine, Ser: 0.85 mg/dL (ref 0.61–1.24)
GFR, Estimated: >60 mL/min (ref >60)
Glucose, Bld: 213 mg/dL — ABNORMAL HIGH (ref 70–99)
Potassium: 4.1 mmol/L (ref 3.5–5.1)
Sodium: 138 mmol/L (ref 135–145)
Total Bilirubin: 0.7 mg/dL (ref 0.3–1.2)
Total Protein: 6.4 g/dL — ABNORMAL LOW (ref 6.5–8.1)

## 2020-08-29 LAB — GLUCOSE, CAPILLARY
Glucose-Capillary: 183 mg/dL — ABNORMAL HIGH (ref 70–99)
Glucose-Capillary: 252 mg/dL — ABNORMAL HIGH (ref 70–99)
Glucose-Capillary: 270 mg/dL — ABNORMAL HIGH (ref 70–99)
Glucose-Capillary: 356 mg/dL — ABNORMAL HIGH (ref 70–99)

## 2020-08-29 LAB — MAGNESIUM: Magnesium: 2.1 mg/dL (ref 1.7–2.4)

## 2020-08-29 LAB — C-REACTIVE PROTEIN: CRP: 4.9 mg/dL — ABNORMAL HIGH (ref <1.0)

## 2020-08-29 LAB — FIBRIN DERIVATIVES D-DIMER (ARMC ONLY): Fibrin derivatives D-dimer (ARMC): 1530.33 ng/mL (FEU) — ABNORMAL HIGH (ref 0.00–499.00)

## 2020-08-29 LAB — FERRITIN: Ferritin: 1441 ng/mL — ABNORMAL HIGH (ref 24–336)

## 2020-08-29 MED ORDER — POLYETHYLENE GLYCOL 3350 17 G PO PACK
17.0000 g | PACK | Freq: Every day | ORAL | Status: DC
  Administered 2020-08-29 – 2020-08-31 (×3): 17 g via ORAL
  Filled 2020-08-29 (×3): qty 1

## 2020-08-29 NOTE — Progress Notes (Signed)
PROGRESS NOTE    Gregg Leon  UJW:[id number] DOB: 02/19/1976 DOA: 08/27/2020 PCP: Patient, No Pcp Per   Chief complaint.  Shortness of breath. Brief Narrative:  Gregg Leon 54 y.o.malewith a known history of dyslipidemia, who presented to the emergency room with acute onset ofworsening dyspnea as well as mainly dry and occasionally productive cough without wheezing and intermittent fever and chills over the last week. Upon presentation to the emergency room, she was 103.1 with heart rate of 106 and respirate of 28and pulse currently 84% on room air that was up to 96% on 4 L of O2 by nasal cannula. COVID-19 PCR came back positive and influenza antigens were negative. Blood culture was drawn. Portable chest ray showed mild increased interstitial markings in both lung bases that may be due to atelectasis and/or infectious etiology as well as pulmonary vascular congestion.   Assessment & Plan:   Active Problems:   Acute hypoxemic respiratory failure due to COVID-19 [facility](HCC)   Pneumonia due to COVID-19 virus   Elevated liver enzymes   Leukopenia   Thrombocytopenia (HCC)   Hyperglycemia, drug-induced  #1.  Acute hypoxemic respite failure secondary to Covid pneumonia. Multifocal pneumonia secondary to COVID-19 infection. Patient condition is relatively stable.  Continue current treatment with Solu-Medrol, remdesivir and baricitinib. Continue prophylaxis. Patient does not have secondary bacterial infection or volume overload.  2.  Hypokalemia. Condition improved  3.  Liver function changes secondary to Covid infection Condition seem to be stable.  4.  Leukopenia and thrombocytopenia. Secondary to Covid infection. Condition improved.       DVT prophylaxis: Lovenox Code Status: Full Family Communication: None Disposition Plan:  .   Status is: Inpatient  Remains inpatient appropriate because:Inpatient level of care appropriate due to severity  of illness   Dispo: The patient is from: Home              Anticipated d/c is to: Home              Anticipated d/c date is: 2 days              Patient currently is not medically stable to d/c.        I/O last 3 completed shifts: In: 1534.6 [I.V.:244.6; IV Piggyback:1290] Out: 975 [Urine:975] Total I/O In: -  Out: 500 [Urine:500]     Consultants:   None  Procedures: None  Antimicrobials:None  Subjective: Patient feels well today, still on 4 to 5 L oxygen.  No segment short of breath, cough, nonproductive. No fever chills pain No abdominal pain nausea vomiting. No headache or dizziness per No dysuria hematuria.  Objective: Vitals:   08/29/20 0641 08/29/20 0700 08/29/20 0806 08/29/20 1230  BP: 114/72  128/79 115/72  Pulse: 62   66  Resp: 18   16  Temp: 97.7 F (36.5 C)  98.3 F (36.8 C) 98.2 F (36.8 C)  TempSrc: Oral  Oral Oral  SpO2: 90% 93%  91%  Weight:      Height:        Intake/Output Summary (Last 24 hours) at 08/29/2020 1241 Last data filed at 08/29/2020 [id number] Gross per 24 hour  Intake --  Output 1475 ml  Net -1475 ml   Filed Weights   08/27/20 2103 08/28/20 0023  Weight: 78 kg 75.7 kg    Examination:  General exam: Appears calm and comfortable  Respiratory system: Clear to auscultation. Respiratory effort normal. Cardiovascular system: S1 & S2 heard, RRR. No JVD, murmurs, rubs,  gallops or clicks. No pedal edema. Gastrointestinal system: Abdomen is nondistended, soft and nontender. No organomegaly or masses felt. Normal bowel sounds heard. Central nervous system: Alert and oriented. No focal neurological deficits. Extremities: Symmetric 5 x 5 power. Skin: No rashes, lesions or ulcers Psychiatry:  Mood & affect appropriate.     Data Reviewed: I have personally reviewed following labs and imaging studies  CBC: Recent Labs  Lab 08/27/20 2118 08/28/20 0441 08/29/20 0324  WBC 5.4 3.0* 5.8  NEUTROABS 4.2 2.2 4.5  HGB 14.9 13.6 13.5   HCT 43.3 39.4 38.7*  MCV 87.8 87.0 88.2  PLT 153 138* 175   Basic Metabolic Panel: Recent Labs  Lab 08/27/20 2118 08/28/20 0441 08/29/20 0324  NA 136 138 138  K 3.4* 3.8 4.1  CL 100 104 105  CO2 24 28 25   GLUCOSE 196* 235* 213*  BUN 13 15 25*  CREATININE 0.92 0.74 0.85  CALCIUM 7.9* 7.6* 8.2*  MG  --   --  2.1   GFR: Estimated Creatinine Clearance: 105.8 mL/min (by C-G formula based on SCr of 0.85 mg/dL). Liver Function Tests: Recent Labs  Lab 08/27/20 2118 08/28/20 0441 08/29/20 0324  AST 121* 113* 140*  ALT 122* 122* 169*  ALKPHOS 151* 139* 124  BILITOT 1.0 0.9 0.7  PROT 7.4 6.6 6.4*  ALBUMIN 3.5 3.0* 2.9*   No results for input(s): LIPASE, AMYLASE in the last 168 hours. No results for input(s): AMMONIA in the last 168 hours. Coagulation Profile: No results for input(s): INR, PROTIME in the last 168 hours. Cardiac Enzymes: No results for input(s): CKTOTAL, CKMB, CKMBINDEX, TROPONINI in the last 168 hours. BNP (last 3 results) No results for input(s): PROBNP in the last 8760 hours. HbA1C: Recent Labs    08/28/20 0433  HGBA1C 6.0*   CBG: Recent Labs  Lab 08/28/20 1140 08/28/20 2116 08/29/20 0741 08/29/20 1230  GLUCAP 291* 257* 183* 252*   Lipid Profile: No results for input(s): CHOL, HDL, LDLCALC, TRIG, CHOLHDL, LDLDIRECT in the last 72 hours. Thyroid Function Tests: No results for input(s): TSH, T4TOTAL, FREET4, T3FREE, THYROIDAB in the last 72 hours. Anemia Panel: Recent Labs    08/28/20 0441 08/29/20 0324  FERRITIN 1,032* 1,441*   Sepsis Labs: Recent Labs  Lab 08/27/20 2118 08/27/20 2333  PROCALCITON  --  0.13  LATICACIDVEN 1.6  --     Recent Results (from the past 240 hour(s))  Respiratory Panel by RT PCR (Flu A&B, Covid) - Nasopharyngeal Swab     Status: Abnormal   Collection Time: 08/27/20  9:18 PM   Specimen: Nasopharyngeal Swab  Result Value Ref Range Status   SARS Coronavirus 2 by RT PCR POSITIVE (A) NEGATIVE Final     Comment: RESULT CALLED TO, READ BACK BY AND VERIFIED WITH: ASHTON PETERS AT 2225 ON 08/27/20 BY SS (NOTE) SARS-CoV-2 target nucleic acids are DETECTED.  SARS-CoV-2 RNA is generally detectable in upper respiratory specimens  during the acute phase of infection. Positive results are indicative of the presence of the identified virus, but do not rule out bacterial infection or co-infection with other pathogens not detected by the test. Clinical correlation with patient history and other diagnostic information is necessary to determine patient infection status. The expected result is Negative.  Fact Sheet for Patients:  13/6/21  Fact Sheet for Healthcare Providers: https://www.moore.com/  This test is not yet approved or cleared by the https://www.young.biz/ FDA and  has been authorized for detection and/or diagnosis of SARS-CoV-2 by FDA  under an Emergency Use Authorization (EUA).  This EUA will remain in effect (meaning this test can  be used) for the duration of  the COVID-19 declaration under Section 564(b)(1) of the Act, 21 U.S.C. section 360bbb-3(b)(1), unless the authorization is terminated or revoked sooner.      Influenza A by PCR NEGATIVE NEGATIVE Final   Influenza B by PCR NEGATIVE NEGATIVE Final    Comment: (NOTE) The Xpert Xpress SARS-CoV-2/FLU/RSV assay is intended as an aid in  the diagnosis of influenza from Nasopharyngeal swab specimens and  should not be used as a sole basis for treatment. Nasal washings and  aspirates are unacceptable for Xpert Xpress SARS-CoV-2/FLU/RSV  testing.  Fact Sheet for Patients: https://www.moore.com/  Fact Sheet for Healthcare Providers: https://www.young.biz/  This test is not yet approved or cleared by the [place] FDA and  has been authorized for detection and/or diagnosis of SARS-CoV-2 by  FDA under an Emergency Use Authorization (EUA). This  EUA will remain  in effect (meaning this test can be used) for the duration of the  Covid-19 declaration under Section 564(b)(1) of the Act, 21  U.S.C. section 360bbb-3(b)(1), unless the authorization is  terminated or revoked. Performed at [facility], [address] [place], [place] [address]   Blood culture (single)     Status: None (Preliminary result)   Collection Time: 08/27/20  9:18 PM   Specimen: BLOOD  Result Value Ref Range Status   Specimen Description BLOOD BLOOD RIGHT HAND  Final   Special Requests   Final    BOTTLES DRAWN AEROBIC AND ANAEROBIC Blood Culture results may not be optimal due to an excessive volume of blood received in culture bottles   Culture   Final    NO GROWTH 2 DAYS Performed at [facility], [address], [place], [place] [address]    Report Status PENDING  Incomplete         Radiology Studies: DG Chest Portable 1 View  Result Date: 08/27/2020 CLINICAL DATA:  Hypoxic EXAM: PORTABLE CHEST 1 VIEW COMPARISON:  None. FINDINGS: The heart size and mediastinal contours are within normal limits. There is prominence the central pulmonary vasculature. Increased interstitial markings are seen at both lung bases. The visualized skeletal structures are unremarkable. IMPRESSION: Pulmonary vascular congestion. Mildly increased interstitial markings of both lung bases which may be due to atelectasis and/or infectious etiology. Electronically Signed   By: Jonna Clark M.D.   On: 08/27/2020 21:51        Scheduled Meds: . vitamin C  500 mg Oral Daily  . aspirin EC  81 mg Oral Daily  . baricitinib  4 mg Oral Daily  . cholecalciferol  1,000 Units Oral Daily  . enoxaparin (LOVENOX) injection  40 mg Subcutaneous Q24H  . famotidine  20 mg Oral BID  . guaiFENesin  600 mg Oral BID  . influenza vac split quadrivalent PF  0.5 mL Intramuscular Tomorrow-1000  . insulin aspart  0-9 Units Subcutaneous TID WC  . methylPREDNISolone (SOLU-MEDROL)  injection  1 mg/kg Intravenous Q12H   Followed by  . [START ON 08/31/2020] predniSONE  50 mg Oral Daily  . polyethylene glycol  17 g Oral Daily  . zinc sulfate  220 mg Oral Daily   Continuous Infusions: . remdesivir 100 mg in NS 100 mL 100 mg (08/29/20 0903)     LOS: 2 days    Time spent: 27 minutes    Marrion Coy, MD [facility]   To contact the  attending provider between 7A-7P or the covering provider during after hours 7P-7A, please log into the web site [email or web address] and access using universal [place] password for that web site. If you do not have the password, please call the hospital operator.  08/29/2020, 12:41 PM

## 2020-08-29 NOTE — Plan of Care (Signed)
  Problem: Education: Goal: Knowledge of risk factors and measures for prevention of condition will improve Outcome: Progressing   Problem: Coping: Goal: Psychosocial and spiritual needs will be supported Outcome: Progressing   Problem: Respiratory: Goal: Will maintain a patent airway Outcome: Progressing Goal: Complications related to the disease process, condition or treatment will be avoided or minimized Outcome: Progressing   Problem: Education: Goal: Knowledge of General Education information will improve Description: Including pain rating scale, medication(s)/side effects and non-pharmacologic comfort measures Outcome: Progressing   Problem: Health Behavior/Discharge Planning: Goal: Ability to manage health-related needs will improve Outcome: Progressing   Problem: Clinical Measurements: Goal: Will remain free from infection Outcome: Progressing Goal: Diagnostic test results will improve Outcome: Progressing Goal: Respiratory complications will improve Outcome: Progressing   Problem: Activity: Goal: Risk for activity intolerance will decrease Outcome: Progressing   Problem: Nutrition: Goal: Adequate nutrition will be maintained Outcome: Progressing   Problem: Pain Managment: Goal: General experience of comfort will improve Outcome: Progressing   Problem: Safety: Goal: Ability to remain free from injury will improve Outcome: Progressing   Problem: Skin Integrity: Goal: Risk for impaired skin integrity will decrease Outcome: Progressing

## 2020-08-29 NOTE — Progress Notes (Signed)
Inpatient Diabetes Program Recommendations  AACE/ADA: New Consensus Statement on Inpatient Glycemic Control (2015)  Target Ranges:  Prepandial:   less than 140 mg/dL      Peak postprandial:   less than 180 mg/dL (1-2 hours)      Critically ill patients:  140 - 180 mg/dL   Lab Results  Component Value Date   GLUCAP 183 (H) 08/29/2020   HGBA1C 6.0 (H) 08/28/2020    Review of Glycemic Control  Diabetes history: No hx DM  Inpatient Diabetes Program Recommendations:   Secure chat to Dr. Chipper Herb: -Add Novolog hs 0-5 correction scale.  Thank you, Billy Fischer. Retal Tonkinson, RN, MSN, CDE  Diabetes Coordinator Inpatient Glycemic Control Team Team Pager [phone] (8am-5pm) 08/29/2020 12:17 PM

## 2020-08-29 NOTE — Progress Notes (Signed)
CH attempted phone call per OR for AD education; pt. seemed to have difficulty hearing/understanding CH, potentially due to fans in rm. and/or Spanish as primary language; CH left AD in Spanish w/charge RN to bring to pt. CH hopes to follow-up in person tomorrow.

## 2020-08-30 DIAGNOSIS — R748 Abnormal levels of other serum enzymes: Secondary | ICD-10-CM | POA: Diagnosis not present

## 2020-08-30 DIAGNOSIS — R739 Hyperglycemia, unspecified: Secondary | ICD-10-CM | POA: Diagnosis not present

## 2020-08-30 DIAGNOSIS — U071 COVID-19: Secondary | ICD-10-CM | POA: Diagnosis not present

## 2020-08-30 DIAGNOSIS — D696 Thrombocytopenia, unspecified: Secondary | ICD-10-CM | POA: Diagnosis not present

## 2020-08-30 LAB — COMPREHENSIVE METABOLIC PANEL
ALT: 177 U/L — ABNORMAL HIGH (ref 0–44)
AST: 72 U/L — ABNORMAL HIGH (ref 15–41)
Albumin: 2.9 g/dL — ABNORMAL LOW (ref 3.5–5.0)
Alkaline Phosphatase: 117 U/L (ref 38–126)
Anion gap: 11 (ref 5–15)
BUN: 25 mg/dL — ABNORMAL HIGH (ref 6–20)
CO2: 24 mmol/L (ref 22–32)
Calcium: 8 mg/dL — ABNORMAL LOW (ref 8.9–10.3)
Chloride: 102 mmol/L (ref 98–111)
Creatinine, Ser: 0.72 mg/dL (ref 0.61–1.24)
GFR, Estimated: >60 mL/min (ref >60)
Glucose, Bld: 305 mg/dL — ABNORMAL HIGH (ref 70–99)
Potassium: 4.2 mmol/L (ref 3.5–5.1)
Sodium: 137 mmol/L (ref 135–145)
Total Bilirubin: 0.7 mg/dL (ref 0.3–1.2)
Total Protein: 6.4 g/dL — ABNORMAL LOW (ref 6.5–8.1)

## 2020-08-30 LAB — CBC WITH DIFFERENTIAL/PLATELET
Abs Immature Granulocytes: 0.25 10*3/uL — ABNORMAL HIGH (ref 0.00–0.07)
Basophils Absolute: 0 10*3/uL (ref 0.0–0.1)
Basophils Relative: 0 %
Eosinophils Absolute: 0 10*3/uL (ref 0.0–0.5)
Eosinophils Relative: 0 %
HCT: 39.9 % (ref 39.0–52.0)
Hemoglobin: 13.8 g/dL (ref 13.0–17.0)
Immature Granulocytes: 3 %
Lymphocytes Relative: 10 %
Lymphs Abs: 0.9 10*3/uL (ref 0.7–4.0)
MCH: 30.5 pg (ref 26.0–34.0)
MCHC: 34.6 g/dL (ref 30.0–36.0)
MCV: 88.3 fL (ref 80.0–100.0)
Monocytes Absolute: 0.4 10*3/uL (ref 0.1–1.0)
Monocytes Relative: 4 %
Neutro Abs: 7.3 10*3/uL (ref 1.7–7.7)
Neutrophils Relative %: 83 %
Platelets: 215 10*3/uL (ref 150–400)
RBC: 4.52 MIL/uL (ref 4.22–5.81)
RDW: 13.3 % (ref 11.5–15.5)
Smear Review: NORMAL
WBC: 8.8 10*3/uL (ref 4.0–10.5)
nRBC: 0.2 % (ref 0.0–0.2)

## 2020-08-30 LAB — FIBRIN DERIVATIVES D-DIMER (ARMC ONLY): Fibrin derivatives D-dimer (ARMC): 1188.02 ng/mL (FEU) — ABNORMAL HIGH (ref 0.00–499.00)

## 2020-08-30 LAB — GLUCOSE, CAPILLARY
Glucose-Capillary: 270 mg/dL — ABNORMAL HIGH (ref 70–99)
Glucose-Capillary: 242 mg/dL — ABNORMAL HIGH (ref 70–99)
Glucose-Capillary: 303 mg/dL — ABNORMAL HIGH (ref 70–99)
Glucose-Capillary: 405 mg/dL — ABNORMAL HIGH (ref 70–99)
Glucose-Capillary: 284 mg/dL — ABNORMAL HIGH (ref 70–99)

## 2020-08-30 LAB — FERRITIN: Ferritin: 854 ng/mL — ABNORMAL HIGH (ref 24–336)

## 2020-08-30 LAB — C-REACTIVE PROTEIN: CRP: 1.7 mg/dL — ABNORMAL HIGH (ref <1.0)

## 2020-08-30 MED ORDER — INSULIN GLARGINE 100 UNIT/ML ~~LOC~~ SOLN
20.0000 [IU] | Freq: Every day | SUBCUTANEOUS | Status: DC
  Administered 2020-08-30 – 2020-08-31 (×2): 20 [IU] via SUBCUTANEOUS
  Filled 2020-08-30 (×3): qty 0.2

## 2020-08-30 MED ORDER — INSULIN ASPART 100 UNIT/ML ~~LOC~~ SOLN
0.0000 [IU] | Freq: Three times a day (TID) | SUBCUTANEOUS | Status: DC
  Administered 2020-08-30: 18:00:00 11 [IU] via SUBCUTANEOUS
  Administered 2020-08-30: 5 [IU] via SUBCUTANEOUS
  Administered 2020-08-31: 3 [IU] via SUBCUTANEOUS
  Administered 2020-08-31: 5 [IU] via SUBCUTANEOUS
  Filled 2020-08-30 (×4): qty 1

## 2020-08-30 MED ORDER — HYDROCOD POLST-CPM POLST ER 10-8 MG/5ML PO SUER: 5.0000 mL | Freq: Two times a day (BID) | ORAL | Status: DC | PRN

## 2020-08-30 MED ORDER — APIXABAN 5 MG PO TABS
5.0000 mg | ORAL_TABLET | Freq: Two times a day (BID) | ORAL | Status: DC
  Administered 2020-08-30 – 2020-08-31 (×3): 5 mg via ORAL
  Filled 2020-08-30 (×3): qty 1

## 2020-08-30 MED ORDER — INSULIN ASPART 100 UNIT/ML ~~LOC~~ SOLN
0.0000 [IU] | Freq: Every day | SUBCUTANEOUS | Status: DC
  Administered 2020-08-30: 23:00:00 2 [IU] via SUBCUTANEOUS
  Filled 2020-08-30: qty 1

## 2020-08-30 NOTE — Progress Notes (Signed)
Inpatient Diabetes Program Recommendations  AACE/ADA: New Consensus Statement on Inpatient Glycemic Control (2015)  Target Ranges:  Prepandial:   less than 140 mg/dL      Peak postprandial:   less than 180 mg/dL (1-2 hours)      Critically ill patients:  140 - 180 mg/dL   Lab Results  Component Value Date   GLUCAP 270 (H) 08/30/2020   HGBA1C 6.0 (H) 08/28/2020    Review of Glycemic Control Results for Gregg Leon, Gregg Leon (MRN [id number]) as of 08/30/2020 11:43  Ref. Range 08/29/2020 07:41 08/29/2020 12:30 08/29/2020 16:44 08/29/2020 20:56 08/30/2020 08:46  Glucose-Capillary Latest Ref Range: 70 - 99 mg/dL 829 (H) 937 (H) 169 (H) 356 (H) 270 (H)   Inpatient Diabetes Program Recommendations:    -Increase Novolog correction to moderate 0-15 units tid + hs 0-5 units.  Thank you, Billy Fischer. Clarann Helvey, RN, MSN, CDE  Diabetes Coordinator Inpatient Glycemic Control Team Team Pager [phone] (8am-5pm) 08/30/2020 11:44 AM

## 2020-08-30 NOTE — Progress Notes (Signed)
PROGRESS NOTE    Gregg Leon  BXI:[id number] DOB: [birth year] DOA: 08/27/2020 PCP: Patient, No Pcp Per   Chief complaint. Shortness of breath. Brief Narrative:  Gregg Leon aHispanic 54 y.o.malewith a known history of dyslipidemia, who presented to the emergency room with acute onset ofworsening dyspnea as well as mainly dry and occasionally productive cough without wheezing and intermittent fever and chills over the last week. Upon presentation to the emergency room, she was 103.1 with heart rate of 106 and respirate of 28and pulse currently 84% on room air that was up to 96% on 4 L of O2 by nasal cannula.COVID-19 PCR came back positive and influenza antigens were negative. Blood culture was drawn. Portable chest ray showed mild increased interstitial markings in both lung bases that may be due to atelectasis and/or infectious etiology as well as pulmonary vascular congestion. Patient is placed on IV steroids, remdesivir, baricitinib.   Assessment & Plan:   Active Problems:   Acute hypoxemic respiratory failure due to COVID-19 [facility])   Pneumonia due to COVID-19 virus   Elevated liver enzymes   Leukopenia   Thrombocytopenia (HCC)   Hyperglycemia, drug-induced  #1. Acute hypoxemic respiratory failure secondary to Covid pneumonia. Multifocal pneumonia secondary to COVID-19 infection. Patient does not have volume overload, does not have secondary bacterial infection. Continue Solu-Medrol, remdesivir and baricitinib. Patient had a significant elevation in D-dimer, changed to Eliquis 5 mg twice a day. Patient still requiring 4 to 5 L oxygen, but symptomatically improving. Intermittent proning advised. Anticipating discharge in 3 to 4 days.  #2. Liver function changes secondary to Covid infection. Condition stable per  3. Leukopenia and thrombocytopenia. Secondary to Covid infection. Today's labs still pending.  4. Hypokalemia. Improved.  5. Hyperglycemia  secondary to steroids. On sliding scale insulin. Hemoglobin A1c 6.0.   DVT prophylaxis: Eliquis Code Status: Full Family Communication: None Disposition Plan:  .   Status is: Inpatient  Remains inpatient appropriate because:Inpatient level of care appropriate due to severity of illness   Dispo: The patient is from: Home              Anticipated d/c is to: Home              Anticipated d/c date is: > 3 days              Patient currently is not medically stable to d/c.        I/O last 3 completed shifts: In: 200 [IV Piggyback:200] Out: 2375 [Urine:2375] No intake/output data recorded.     Consultants:   None  Procedures: None  Antimicrobials: None  Subjective: Patient still on 5 L oxygen, but feels better. Has a cough, with some white mucus. Good appetite, no nausea vomiting. No diarrhea or constipation. Denies any fever chills. No headache or dizziness. No dysuria or hematuria.  Objective: Vitals:   08/29/20 2057 08/29/20 2348 08/30/20 0521 08/30/20 0810  BP: 129/75 131/71 140/82 126/85  Pulse: 61 68 (!) 51 61  Resp: 16 18 18  (!) 22  Temp: 98.2 F (36.8 C) 98.5 F (36.9 C) 98.4 F (36.9 C) 98.6 F (37 C)  TempSrc:    Oral  SpO2: 93% 92% 93% 92%  Weight:      Height:        Intake/Output Summary (Last 24 hours) at 08/30/2020 0916 Last data filed at 08/30/2020 0600 Gross per 24 hour  Intake 200 ml  Output 900 ml  Net -700 ml   13/06/2020  08/27/20 2103 08/28/20 0023  Weight: 78 kg 75.7 kg    Examination:  General exam: Appears calm and comfortable  Respiratory system: Clear to auscultation. Respiratory effort normal. Cardiovascular system: S1 & S2 heard, RRR. No JVD, murmurs, rubs, gallops or clicks. No pedal edema. Gastrointestinal system: Abdomen is nondistended, soft and nontender. No organomegaly or masses felt. Normal bowel sounds heard. Central nervous system: Alert and oriented. No focal neurological deficits. Extremities:  Symmetric  Skin: No rashes, lesions or ulcers Psychiatry:  Mood & affect appropriate.     Data Reviewed: I have personally reviewed following labs and imaging studies  CBC: Recent Labs  Lab 08/27/20 2118 08/28/20 0441 08/29/20 0324  WBC 5.4 3.0* 5.8  NEUTROABS 4.2 2.2 4.5  HGB 14.9 13.6 13.5  HCT 43.3 39.4 38.7*  MCV 87.8 87.0 88.2  PLT 153 138* 175   Basic Metabolic Panel: Recent Labs  Lab 08/27/20 2118 08/28/20 0441 08/29/20 0324  NA 136 138 138  K 3.4* 3.8 4.1  CL 100 104 105  CO2 24 28 25   GLUCOSE 196* 235* 213*  BUN 13 15 25*  CREATININE 0.92 0.74 0.85  CALCIUM 7.9* 7.6* 8.2*  MG  --   --  2.1   GFR: Estimated Creatinine Clearance: 95 mL/min (by C-G formula based on SCr of 0.85 mg/dL). Liver Function Tests: Recent Labs  Lab 08/27/20 2118 08/28/20 0441 08/29/20 0324  AST 121* 113* 140*  ALT 122* 122* 169*  ALKPHOS 151* 139* 124  BILITOT 1.0 0.9 0.7  PROT 7.4 6.6 6.4*  ALBUMIN 3.5 3.0* 2.9*   No results for input(s): LIPASE, AMYLASE in the last 168 hours. No results for input(s): AMMONIA in the last 168 hours. Coagulation Profile: No results for input(s): INR, PROTIME in the last 168 hours. Cardiac Enzymes: No results for input(s): CKTOTAL, CKMB, CKMBINDEX, TROPONINI in the last 168 hours. BNP (last 3 results) No results for input(s): PROBNP in the last 8760 hours. HbA1C: Recent Labs    08/28/20 0433  HGBA1C 6.0*   CBG: Recent Labs  Lab 08/29/20 0741 08/29/20 1230 08/29/20 1644 08/29/20 2056 08/30/20 0846  GLUCAP 183* 252* 270* 356* 270*   Lipid Profile: No results for input(s): CHOL, HDL, LDLCALC, TRIG, CHOLHDL, LDLDIRECT in the last 72 hours. Thyroid Function Tests: No results for input(s): TSH, T4TOTAL, FREET4, T3FREE, THYROIDAB in the last 72 hours. Anemia Panel: Recent Labs    08/28/20 0441 08/29/20 0324  FERRITIN 1,032* 1,441*   Sepsis Labs: Recent Labs  Lab 08/27/20 2118 08/27/20 2333  PROCALCITON  --  0.13    LATICACIDVEN 1.6  --     Recent Results (from the past 240 hour(s))  Respiratory Panel by RT PCR (Flu A&B, Covid) - Nasopharyngeal Swab     Status: Abnormal   Collection Time: 08/27/20  9:18 PM   Specimen: Nasopharyngeal Swab  Result Value Ref Range Status   SARS Coronavirus 2 by RT PCR POSITIVE (A) NEGATIVE Final    Comment: RESULT CALLED TO, READ BACK BY AND VERIFIED WITH: ASHTON PETERS AT 2225 ON 08/27/20 BY SS (NOTE) SARS-CoV-2 target nucleic acids are DETECTED.  SARS-CoV-2 RNA is generally detectable in upper respiratory specimens  during the acute phase of infection. Positive results are indicative of the presence of the identified virus, but do not rule out bacterial infection or co-infection with other pathogens not detected by the test. Clinical correlation with patient history and other diagnostic information is necessary to determine patient infection status. The expected result is  Negative.  Fact Sheet for Patients:  https://www.moore.com/  Fact Sheet for Healthcare Providers: https://www.young.biz/  This test is not yet approved or cleared by the [place] FDA and  has been authorized for detection and/or diagnosis of SARS-CoV-2 by FDA under an Emergency Use Authorization (EUA).  This EUA will remain in effect (meaning this test can  be used) for the duration of  the COVID-19 declaration under Section 564(b)(1) of the Act, 21 U.S.C. section 360bbb-3(b)(1), unless the authorization is terminated or revoked sooner.      Influenza A by PCR NEGATIVE NEGATIVE Final   Influenza B by PCR NEGATIVE NEGATIVE Final    Comment: (NOTE) The Xpert Xpress SARS-CoV-2/FLU/RSV assay is intended as an aid in  the diagnosis of influenza from Nasopharyngeal swab specimens and  should not be used as a sole basis for treatment. Nasal washings and  aspirates are unacceptable for Xpert Xpress SARS-CoV-2/FLU/RSV  testing.  Fact Sheet for  Patients: https://www.moore.com/  Fact Sheet for Healthcare Providers: https://www.young.biz/  This test is not yet approved or cleared by the [place] FDA and  has been authorized for detection and/or diagnosis of SARS-CoV-2 by  FDA under an Emergency Use Authorization (EUA). This EUA will remain  in effect (meaning this test can be used) for the duration of the  Covid-19 declaration under Section 564(b)(1) of the Act, 21  U.S.C. section 360bbb-3(b)(1), unless the authorization is  terminated or revoked. Performed at [facility], [address] [place], [place] [address]   Blood culture (single)     Status: None (Preliminary result)   Collection Time: 08/27/20  9:18 PM   Specimen: BLOOD  Result Value Ref Range Status   Specimen Description BLOOD BLOOD RIGHT HAND  Final   Special Requests   Final    BOTTLES DRAWN AEROBIC AND ANAEROBIC Blood Culture results may not be optimal due to an excessive volume of blood received in culture bottles   Culture   Final    NO GROWTH 3 DAYS Performed at [facility], [address], [place], [place] [address]    Report Status PENDING  Incomplete         Radiology Studies: No results found.      Scheduled Meds: . apixaban  5 mg Oral BID  . vitamin C  500 mg Oral Daily  . aspirin EC  81 mg Oral Daily  . baricitinib  4 mg Oral Daily  . cholecalciferol  1,000 Units Oral Daily  . famotidine  20 mg Oral BID  . guaiFENesin  600 mg Oral BID  . influenza vac split quadrivalent PF  0.5 mL Intramuscular Tomorrow-1000  . insulin aspart  0-9 Units Subcutaneous TID WC  . methylPREDNISolone (SOLU-MEDROL) injection  1 mg/kg Intravenous Q12H   Followed by  . [START ON 08/31/2020] predniSONE  50 mg Oral Daily  . polyethylene glycol  17 g Oral Daily  . zinc sulfate  220 mg Oral Daily   Continuous Infusions: . remdesivir 100 mg in NS 100 mL Stopped (08/29/20 0933)     LOS: 3  days    Time spent: 28 minutes    Marrion Coy, MD [facility]   To contact the attending provider between 7A-7P or the covering provider during after hours 7P-7A, please log into the web site [email or web address] and access using universal [place] password for that web site. If you do not have the password, please call the hospital operator.  08/30/2020, 9:16 AM

## 2020-08-30 NOTE — Plan of Care (Signed)
Shift assessment completed with assistance of video interpreter. Pt comfortable and does not have further questions at this time. Will continue to monitor.

## 2020-08-30 NOTE — Plan of Care (Signed)
Shift assessment completed with assistance of video interpreter. Pt questions regarding predicted d/c date answered. Pt without further questions.

## 2020-08-30 NOTE — Progress Notes (Signed)
CH visited pt. per OR for AD education; communication difficult via video interpreter because of loud room noise, but pt. says he does not want to complete AD at this time; pt. says he is feeling better; no further needs expressed, but pt. aware of [facility] availability and was grateful for visit.

## 2020-08-31 DIAGNOSIS — J9601 Acute respiratory failure with hypoxia: Secondary | ICD-10-CM | POA: Diagnosis not present

## 2020-08-31 DIAGNOSIS — J1282 Pneumonia due to coronavirus disease 2019: Secondary | ICD-10-CM | POA: Diagnosis not present

## 2020-08-31 DIAGNOSIS — U071 COVID-19: Secondary | ICD-10-CM | POA: Diagnosis not present

## 2020-08-31 LAB — CBC WITH DIFFERENTIAL/PLATELET
Abs Immature Granulocytes: 0.6 10*3/uL — ABNORMAL HIGH (ref 0.00–0.07)
Basophils Absolute: 0 10*3/uL (ref 0.0–0.1)
Basophils Relative: 0 %
Eosinophils Absolute: 0 10*3/uL (ref 0.0–0.5)
Eosinophils Relative: 0 %
HCT: 40.9 % (ref 39.0–52.0)
Hemoglobin: 14.1 g/dL (ref 13.0–17.0)
Immature Granulocytes: 5 %
Lymphocytes Relative: 9 %
Lymphs Abs: 1.1 10*3/uL (ref 0.7–4.0)
MCH: 30.5 pg (ref 26.0–34.0)
MCHC: 34.5 g/dL (ref 30.0–36.0)
MCV: 88.3 fL (ref 80.0–100.0)
Monocytes Absolute: 0.7 10*3/uL (ref 0.1–1.0)
Monocytes Relative: 6 %
Neutro Abs: 9.5 10*3/uL — ABNORMAL HIGH (ref 1.7–7.7)
Neutrophils Relative %: 80 %
Platelets: 234 10*3/uL (ref 150–400)
RBC: 4.63 MIL/uL (ref 4.22–5.81)
RDW: 13.2 % (ref 11.5–15.5)
WBC: 11.9 10*3/uL — ABNORMAL HIGH (ref 4.0–10.5)
nRBC: 0.3 % — ABNORMAL HIGH (ref 0.0–0.2)

## 2020-08-31 LAB — COMPREHENSIVE METABOLIC PANEL
ALT: 186 U/L — ABNORMAL HIGH (ref 0–44)
AST: 66 U/L — ABNORMAL HIGH (ref 15–41)
Albumin: 2.9 g/dL — ABNORMAL LOW (ref 3.5–5.0)
Alkaline Phosphatase: 111 U/L (ref 38–126)
Anion gap: 8 (ref 5–15)
BUN: 25 mg/dL — ABNORMAL HIGH (ref 6–20)
CO2: 27 mmol/L (ref 22–32)
Calcium: 8 mg/dL — ABNORMAL LOW (ref 8.9–10.3)
Chloride: 100 mmol/L (ref 98–111)
Creatinine, Ser: 0.65 mg/dL (ref 0.61–1.24)
GFR, Estimated: >60 mL/min (ref >60)
Glucose, Bld: 200 mg/dL — ABNORMAL HIGH (ref 70–99)
Potassium: 4 mmol/L (ref 3.5–5.1)
Sodium: 135 mmol/L (ref 135–145)
Total Bilirubin: 0.8 mg/dL (ref 0.3–1.2)
Total Protein: 6.1 g/dL — ABNORMAL LOW (ref 6.5–8.1)

## 2020-08-31 LAB — GLUCOSE, CAPILLARY
Glucose-Capillary: 180 mg/dL — ABNORMAL HIGH (ref 70–99)
Glucose-Capillary: 214 mg/dL — ABNORMAL HIGH (ref 70–99)

## 2020-08-31 LAB — FERRITIN: Ferritin: 698 ng/mL — ABNORMAL HIGH (ref 24–336)

## 2020-08-31 LAB — C-REACTIVE PROTEIN: CRP: 1.3 mg/dL — ABNORMAL HIGH (ref <1.0)

## 2020-08-31 LAB — FIBRIN DERIVATIVES D-DIMER (ARMC ONLY): Fibrin derivatives D-dimer (ARMC): 958.76 ng/mL (FEU) — ABNORMAL HIGH (ref 0.00–499.00)

## 2020-08-31 MED ORDER — ASCORBIC ACID 500 MG PO TABS: 500.0000 mg | ORAL_TABLET | Freq: Every day | ORAL | 0 refills | Status: AC

## 2020-08-31 MED ORDER — ZINC SULFATE 220 (50 ZN) MG PO CAPS: 220.0000 mg | ORAL_CAPSULE | Freq: Every day | ORAL | 0 refills | Status: AC

## 2020-08-31 NOTE — TOC Initial Note (Signed)
Transition of Care [facility]) - Initial/Assessment Note    Patient Details  Name: Gregg Leon MRN: [id number] Date of Birth: 15-Sep-1966  Transition of Care [facility]) CM/SW Contact:    Gregg Butcher, RN Phone Number: 08/31/2020, 12:41 PM  Clinical Narrative:                 Patient admitted to the hospital with COVID currently requiring just 1L oxygen.  RNCM was able to speak with patient via phone with the Spanish Interpreter.  Patient reports that he is from home with his wife and minor children.  Patient works doing Biochemist, clinical.  Patient drives and has reliable transportation.  Patient's wife will be able to pick him up at discharge. Patient does not have any insurance or a PCP.  Information on [facility] provided to patient with application for him to fill out.  Referral sent to [facility].  Instructed patient that if he is unable to get an appointment with [facility] within 1 week of hospital discharge that he should follow up at the [facility] in Clinic.   Patient may need home O2, if patient qualifies for O2 [employer] will provide oxygen with a LOG from [facility] department.  TOC team will follow through discharge.   Expected Discharge Plan: Home/Self Care Barriers to Discharge: Continued Medical Work up   Patient Goals and CMS Choice Patient states their goals for this hospitalization and ongoing recovery are:: Will be glad to be going home- agrees to home O2 if needed      Expected Discharge Plan and Services Expected Discharge Plan: Home/Self Care   Discharge Planning Services: CM Consult   Living arrangements for the past 2 months: Single Family Home                           HH Arranged: NA          Prior Living Arrangements/Services Living arrangements for the past 2 months: Single Family Home Lives with:: Spouse, Minor Children Patient language and need for interpreter reviewed:: Yes (Spanish) Do  you feel safe going back to the place where you live?: Yes      Need for Family Participation in Patient Care: Yes (Comment) (COVID) Care giver support system in place?: Yes (comment) (wife)   Criminal Activity/Legal Involvement Pertinent to Current Situation/Hospitalization: No - Comment as needed  Activities of Daily Living Home Assistive Devices/Equipment: None ADL Screening (condition at time of admission) Patient's cognitive ability adequate to safely complete daily activities?: Yes Is the patient deaf or have difficulty hearing?: No Does the patient have difficulty seeing, even when wearing glasses/contacts?: No Does the patient have difficulty concentrating, remembering, or making decisions?: No Patient able to express need for assistance with ADLs?: Yes Does the patient have difficulty dressing or bathing?: No Independently performs ADLs?: Yes (appropriate for developmental age) Does the patient have difficulty walking or climbing stairs?: No Weakness of Legs: None Weakness of Arms/Hands: None  Permission Sought/Granted Permission sought to share information with : Case Manager, Family Supports, Other (comment) Permission granted to share information with : Yes, Verbal Permission Granted  Share Information with NAME: Gregg Leon  Permission granted to share info w AGENCY: [employer]  Permission granted to share info w Relationship: wife     Emotional Assessment   Attitude/Demeanor/Rapport: Engaged Affect (typically observed): Accepting Orientation: : Oriented to Self, Oriented to Place, Oriented to  Time,  Oriented to Situation Alcohol / Substance Use: Not Applicable Psych Involvement: No (comment)  Admission diagnosis:  Shortness of breath [R06.02] Hypoxia [R09.02] Acute hypoxemic respiratory failure due to COVID-19 (HCC) [U07.1, J96.01] COVID-19 [U07.1] Patient Active Problem List   Diagnosis Date Noted  . Pneumonia due to COVID-19 virus 08/28/2020  . Elevated liver enzymes  08/28/2020  . Leukopenia 08/28/2020  . Thrombocytopenia (HCC) 08/28/2020  . Hyperglycemia, drug-induced 08/28/2020  . Acute hypoxemic respiratory failure due to COVID-19 [facility]) 08/27/2020   PCP:  Patient, No Pcp Per Pharmacy:   [facility] - [place], [place] - [address] [place] [address] Phone: [phone] Fax: [phone]     Social Determinants of Health (SDOH) Interventions    Readmission Risk Interventions No flowsheet data found.

## 2020-08-31 NOTE — Progress Notes (Signed)
Pt given DC instructions in [place] and [place]. Translator used and pt without further questions at this time.

## 2020-08-31 NOTE — Discharge Summary (Signed)
Physician Discharge Summary   Palmer Fahrner [place]  male DOB: 1966-04-10  XIH:[id number]  PCP: Patient, No Pcp Per  Admit date: 08/27/2020 Discharge date: 08/31/2020  Admitted From: home Disposition:  home CODE STATUS: Full code   Hospital Course:  For full details, please see H&P, progress notes, consult notes and ancillary notes.  Briefly,  [name], who presented to the emergency room with acute onset ofworsening dyspnea, cough, and intermittent fever and chills over the last week.  Upon presentation to the emergency room, temp was 103.1 with heart rate of 106 and respirate of 28and 84% on room air that was up to 96% on 4 L of O2 by nasal cannula.COVID-19 PCR came back positive and influenza antigens were negative. Portable chest ray showed mild increased interstitial markings in both lung bases.    Acute hypoxemic respiratory failure secondary to Covid pneumonia. Pt completed 5 days of Remdesivir, and received Baricitinib while inpatient.  CRP was 9.6 on presentation, which trended down to 1.3 prior to discharge, after receiving solu-medrol and prednisone.  Pt had elevated D-dimer ~1700 on presentation, so was put on empiric eliquis 5 mg BID, which was not continued on discharge.  Prior to discharge, pt was able to ambulate in room air with 95% saturation.    Liver function changes secondary to Covid infection. Stable.  Leukopenia and thrombocytopenia, resolved Secondary to Covid infection.   Hypokalemia, resolved Repleted PRN  Hyperglycemia secondary to steroids. Prediabetes  Hemoglobin A1c 6.0.  Hyperglycemia due to steroid use which should improve after discharge.  No need to start insulin.  Outpatient followup for further management of prediabetes.     Discharge Diagnoses:  Active Problems:   Acute hypoxemic respiratory failure due to COVID-19 [facility])   Pneumonia due to COVID-19 virus    Elevated liver enzymes   Leukopenia   Thrombocytopenia (HCC)   Hyperglycemia, drug-induced    Discharge Instructions:  Allergies as of 08/31/2020   No Known Allergies     Medication List    STOP taking these medications   acyclovir 400 MG tablet Commonly known as: Zovirax   cyclobenzaprine 5 MG tablet Commonly known as: FLEXERIL   hydrOXYzine 50 MG tablet Commonly known as: ATARAX/VISTARIL   predniSONE 20 MG tablet Commonly known as: Deltasone     TAKE these medications   ascorbic acid 500 MG tablet Commonly known as: VITAMIN C Take 1 tablet (500 mg total) by mouth daily. Start taking on: September 01, 2020   zinc sulfate 220 (50 Zn) MG capsule Take 1 capsule (220 mg total) by mouth daily. Start taking on: September 01, 2020        Follow-up Information    [facility] [place] [place]. Schedule an appointment as soon as possible for a visit in 1 week(s).   Specialty: Primary Care Why: Fill out application for [facility] and call to schedule appointment- if possible within 1 week of discharge from hospital.  Contact information: [address] [place] [address] [phone]       [facility] Go in 1 week(s).   Why: If unable to get appointment at [facility] within 1 week- follow up at [facility] in Clinic 1 week after discharge from  hospital  Contact information: [address] [place] [address] [phone]               No Known Allergies   The  results of significant diagnostics from this hospitalization (including imaging, microbiology, ancillary and laboratory) are listed below for reference.   Consultations:   Procedures/Studies: DG Chest Portable 1 View  Result Date: 08/27/2020 CLINICAL DATA:  Hypoxic EXAM: PORTABLE CHEST 1 VIEW COMPARISON:  None. FINDINGS: The heart size and mediastinal contours are within normal limits. There is prominence the central pulmonary vasculature.  Increased interstitial markings are seen at both lung bases. The visualized skeletal structures are unremarkable. IMPRESSION: Pulmonary vascular congestion. Mildly increased interstitial markings of both lung bases which may be due to atelectasis and/or infectious etiology. Electronically Signed   By: Jonna Clark M.D.   On: 08/27/2020 21:51      Labs: BNP (last 3 results) Recent Labs    08/27/20 2333  BNP 29.7   Basic Metabolic Panel: Recent Labs  Lab 08/27/20 2118 08/28/20 0441 08/29/20 0324 08/30/20 1046 08/31/20 0427  NA 136 138 138 137 135  K 3.4* 3.8 4.1 4.2 4.0  CL 100 104 105 102 100  CO2 24 28 25 24 27   GLUCOSE 196* 235* 213* 305* 200*  BUN 13 15 25* 25* 25*  CREATININE 0.92 0.74 0.85 0.72 0.65  CALCIUM 7.9* 7.6* 8.2* 8.0* 8.0*  MG  --   --  2.1  --   --    Liver Function Tests: Recent Labs  Lab 08/27/20 2118 08/28/20 0441 08/29/20 0324 08/30/20 1046 08/31/20 0427  AST 121* 113* 140* 72* 66*  ALT 122* 122* 169* 177* 186*  ALKPHOS 151* 139* 124 117 111  BILITOT 1.0 0.9 0.7 0.7 0.8  PROT 7.4 6.6 6.4* 6.4* 6.1*  ALBUMIN 3.5 3.0* 2.9* 2.9* 2.9*   No results for input(s): LIPASE, AMYLASE in the last 168 hours. No results for input(s): AMMONIA in the last 168 hours. CBC: Recent Labs  Lab 08/27/20 2118 08/28/20 0441 08/29/20 0324 08/30/20 1046 08/31/20 0427  WBC 5.4 3.0* 5.8 8.8 11.9*  NEUTROABS 4.2 2.2 4.5 7.3 9.5*  HGB 14.9 13.6 13.5 13.8 14.1  HCT 43.3 39.4 38.7* 39.9 40.9  MCV 87.8 87.0 88.2 88.3 88.3  PLT 153 138* 175 215 234   Cardiac Enzymes: No results for input(s): CKTOTAL, CKMB, CKMBINDEX, TROPONINI in the last 168 hours. BNP: Invalid input(s): POCBNP CBG: Recent Labs  Lab 08/30/20 1652 08/30/20 2100 08/30/20 2237 08/31/20 0834 08/31/20 1200  GLUCAP 303* 405* 284* 180* 214*   D-Dimer No results for input(s): DDIMER in the last 72 hours. Hgb A1c No results for input(s): HGBA1C in the last 72 hours. Lipid Profile No results  for input(s): CHOL, HDL, LDLCALC, TRIG, CHOLHDL, LDLDIRECT in the last 72 hours. Thyroid function studies No results for input(s): TSH, T4TOTAL, T3FREE, THYROIDAB in the last 72 hours.  Invalid input(s): FREET3 Anemia work up Recent Labs    08/30/20 1046 08/31/20 0427  FERRITIN 854* 698*   Urinalysis No results found for: COLORURINE, APPEARANCEUR, LABSPEC, PHURINE, GLUCOSEU, HGBUR, BILIRUBINUR, KETONESUR, PROTEINUR, UROBILINOGEN, NITRITE, LEUKOCYTESUR Sepsis Labs Invalid input(s): PROCALCITONIN,  WBC,  LACTICIDVEN Microbiology Recent Results (from the past 240 hour(s))  Respiratory Panel by RT PCR (Flu A&B, Covid) - Nasopharyngeal Swab     Status: Abnormal   Collection Time: 08/27/20  9:18 PM   Specimen: Nasopharyngeal Swab  Result Value Ref Range Status   SARS Coronavirus 2 by RT PCR POSITIVE (A) NEGATIVE Final    Comment: RESULT CALLED TO, READ BACK BY AND VERIFIED WITH: ASHTON PETERS AT 2225 ON 08/27/20 BY SS (NOTE) SARS-CoV-2 target nucleic acids are DETECTED.  SARS-CoV-2 RNA is generally detectable in upper respiratory specimens  during the acute phase of infection. Positive results are indicative of the presence of the identified virus, but do not rule out bacterial infection or co-infection with other pathogens not detected by the test. Clinical correlation with patient history and other diagnostic information is necessary to determine patient infection status. The expected result is Negative.  Fact Sheet for Patients:  https://www.moore.com/  Fact Sheet for Healthcare Providers: https://www.young.biz/  This test is not yet approved or cleared by the [place] FDA and  has been authorized for detection and/or diagnosis of SARS-CoV-2 by FDA under an Emergency Use Authorization (EUA).  This EUA will remain in effect (meaning this test can  be used) for the duration of  the COVID-19 declaration under Section 564(b)(1) of the  Act, 21 U.S.C. section 360bbb-3(b)(1), unless the authorization is terminated or revoked sooner.      Influenza A by PCR NEGATIVE NEGATIVE Final   Influenza B by PCR NEGATIVE NEGATIVE Final    Comment: (NOTE) The Xpert Xpress SARS-CoV-2/FLU/RSV assay is intended as an aid in  the diagnosis of influenza from Nasopharyngeal swab specimens and  should not be used as a sole basis for treatment. Nasal washings and  aspirates are unacceptable for Xpert Xpress SARS-CoV-2/FLU/RSV  testing.  Fact Sheet for Patients: https://www.moore.com/  Fact Sheet for Healthcare Providers: https://www.young.biz/  This test is not yet approved or cleared by the [place] FDA and  has been authorized for detection and/or diagnosis of SARS-CoV-2 by  FDA under an Emergency Use Authorization (EUA). This EUA will remain  in effect (meaning this test can be used) for the duration of the  Covid-19 declaration under Section 564(b)(1) of the Act, 21  U.S.C. section 360bbb-3(b)(1), unless the authorization is  terminated or revoked. Performed at [facility], [address] [place], [place] [address]   Blood culture (single)     Status: None (Preliminary result)   Collection Time: 08/27/20  9:18 PM   Specimen: BLOOD  Result Value Ref Range Status   Specimen Description BLOOD BLOOD RIGHT HAND  Final   Special Requests   Final    BOTTLES DRAWN AEROBIC AND ANAEROBIC Blood Culture results may not be optimal due to an excessive volume of blood received in culture bottles   Culture   Final    NO GROWTH 4 DAYS Performed at [facility], [address], [place], [place] [address]    Report Status PENDING  Incomplete     Total time spend on discharging this patient, including the last patient exam, discussing the hospital stay, instructions for ongoing care as it relates to all pertinent caregivers, as well as preparing the medical discharge records,  prescriptions, and/or referrals as applicable, is 40 minutes.    Darlin Priestly, MD  [facility] 08/31/2020, 12:57 PM

## 2020-09-01 LAB — CULTURE, BLOOD (SINGLE): Culture: NO GROWTH

## 2020-09-20 ENCOUNTER — Telehealth: Payer: Self-pay | Admitting: Pharmacy Technician

## 2020-09-20 NOTE — Telephone Encounter (Signed)
Provided patient with new patient packet to obtain [facility] services.  [facility] must receive requested financial documentation within 30 days in order to determine eligibility and provide medication assistance.  Bobetta Korf J. Melvenia Favela Care Manager [facility] Clinic  

## 2021-12-31 IMAGING — DX DG CHEST 1V PORT
1 series · 1 of 1 positions shown · non-contrast
Comparison: None.

CLINICAL DATA: Hypoxic

EXAM:
PORTABLE CHEST 1 VIEW

[chest ap]
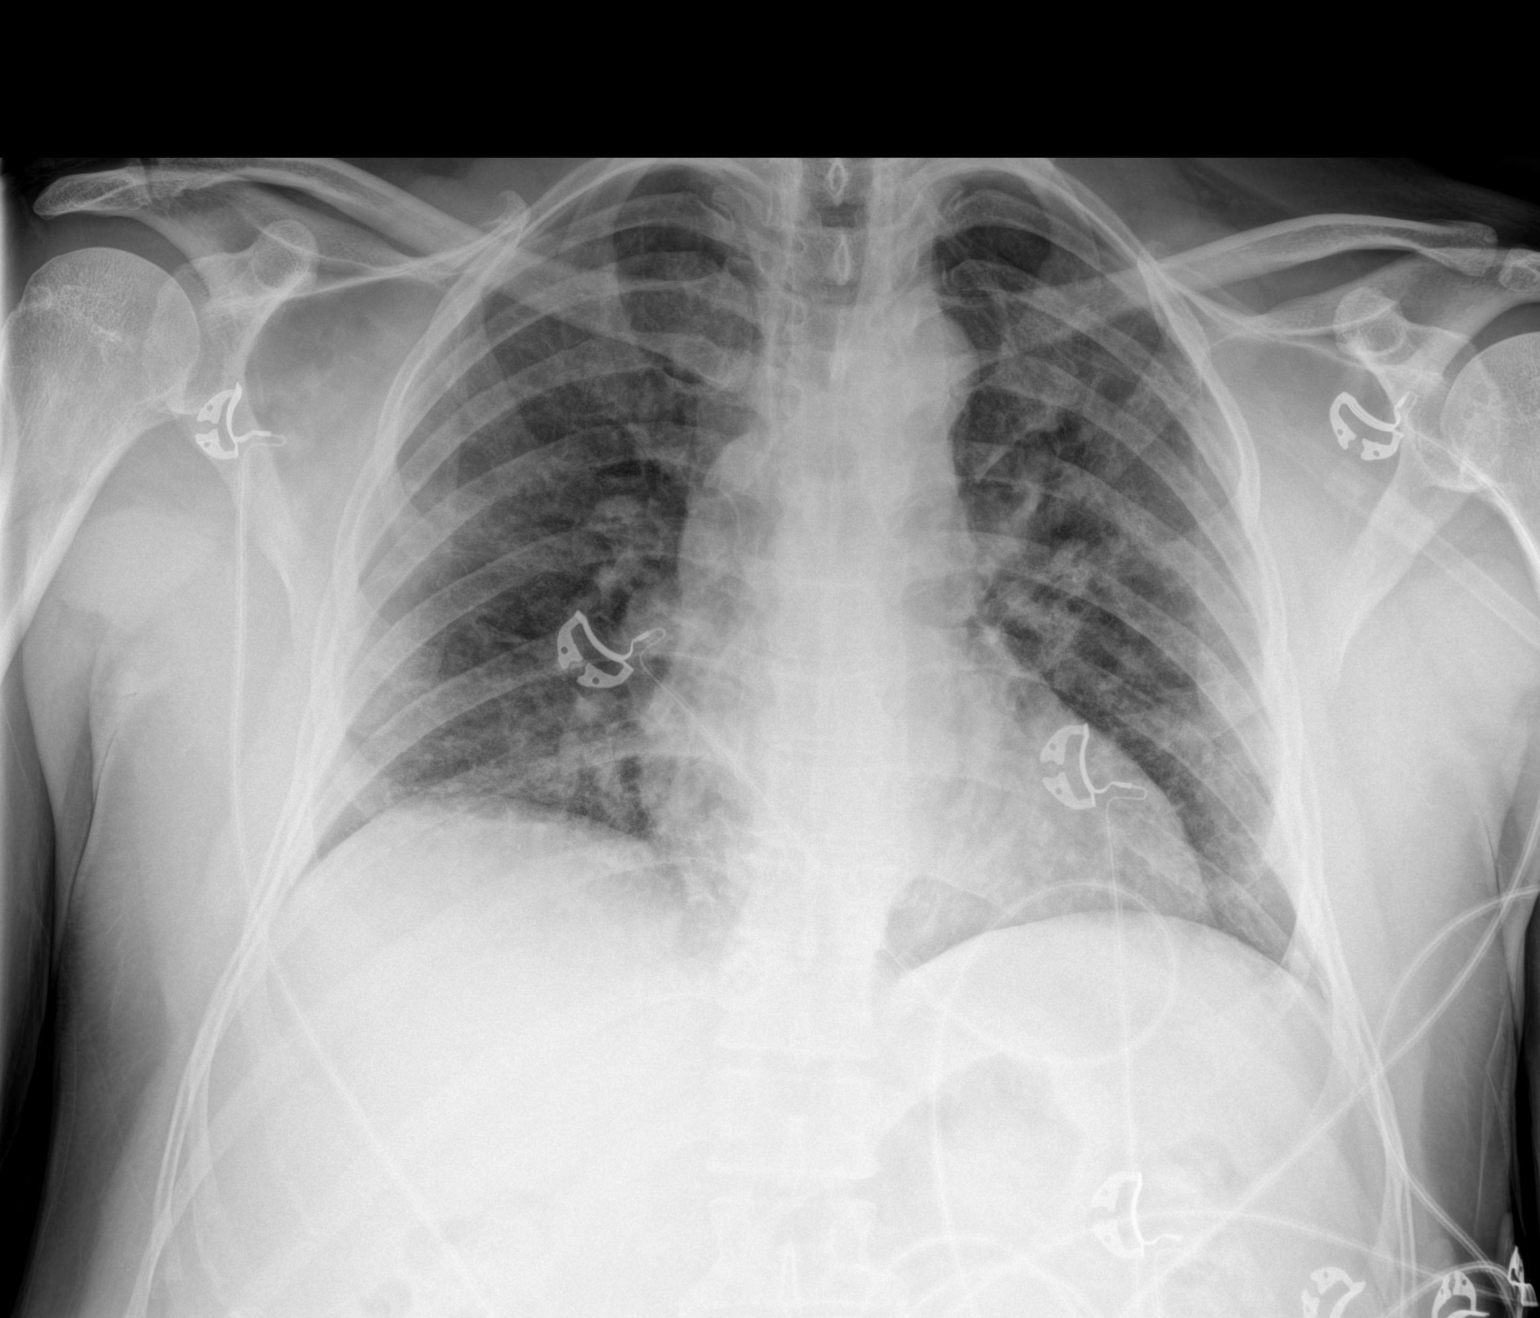

[1 of 1 positions shown; findings below may reference images not displayed]

FINDINGS: The heart size and mediastinal contours are within normal limits.
There is prominence the central pulmonary vasculature. Increased
interstitial markings are seen at both lung bases. The visualized
skeletal structures are unremarkable.
IMPRESSION: Pulmonary vascular congestion.

Mildly increased interstitial markings of both lung bases which may
be due to atelectasis and/or infectious etiology.

## 2022-09-15 ENCOUNTER — Other Ambulatory Visit: Payer: Self-pay

## 2022-09-15 ENCOUNTER — Emergency Department
Admission: EM | Admit: 2022-09-15 | Discharge: 2022-09-15 | Disposition: A | Discharge status: Home / Self Care | Payer: 59 | Attending: Emergency Medicine | Admitting: Emergency Medicine

## 2022-09-15 ENCOUNTER — Encounter: Payer: Self-pay | Admitting: Emergency Medicine

## 2022-09-15 DIAGNOSIS — T7840XA Allergy, unspecified, initial encounter: Secondary | ICD-10-CM | POA: Diagnosis present

## 2022-09-15 LAB — COMPREHENSIVE METABOLIC PANEL
ALT: 52 U/L — ABNORMAL HIGH (ref 0–44)
AST: 31 U/L (ref 15–41)
Albumin: 3.9 g/dL (ref 3.5–5.0)
Alkaline Phosphatase: 58 U/L (ref 38–126)
Anion gap: 9 (ref 5–15)
BUN: 18 mg/dL (ref 6–20)
CO2: 20 mmol/L — ABNORMAL LOW (ref 22–32)
Calcium: 8.4 mg/dL — ABNORMAL LOW (ref 8.9–10.3)
Chloride: 112 mmol/L — ABNORMAL HIGH (ref 98–111)
Creatinine, Ser: 0.83 mg/dL (ref 0.61–1.24)
GFR, Estimated: >60 mL/min (ref >60)
Glucose, Bld: 114 mg/dL — ABNORMAL HIGH (ref 70–99)
Potassium: 4 mmol/L (ref 3.5–5.1)
Sodium: 141 mmol/L (ref 135–145)
Total Bilirubin: 0.7 mg/dL (ref 0.3–1.2)
Total Protein: 6.7 g/dL (ref 6.5–8.1)

## 2022-09-15 LAB — CBC WITH DIFFERENTIAL/PLATELET
Abs Immature Granulocytes: 0.04 10*3/uL (ref 0.00–0.07)
Basophils Absolute: 0 10*3/uL (ref 0.0–0.1)
Basophils Relative: 0 %
Eosinophils Absolute: 0.4 10*3/uL (ref 0.0–0.5)
Eosinophils Relative: 7 %
HCT: 44.3 % (ref 39.0–52.0)
Hemoglobin: 15.5 g/dL (ref 13.0–17.0)
Immature Granulocytes: 1 %
Lymphocytes Relative: 26 %
Lymphs Abs: 1.8 10*3/uL (ref 0.7–4.0)
MCH: 30 pg (ref 26.0–34.0)
MCHC: 35 g/dL (ref 30.0–36.0)
MCV: 85.9 fL (ref 80.0–100.0)
Monocytes Absolute: 0.6 10*3/uL (ref 0.1–1.0)
Monocytes Relative: 9 %
Neutro Abs: 3.8 10*3/uL (ref 1.7–7.7)
Neutrophils Relative %: 57 %
Platelets: 160 10*3/uL (ref 150–400)
RBC: 5.16 MIL/uL (ref 4.22–5.81)
RDW: 13.9 % (ref 11.5–15.5)
WBC: 6.7 10*3/uL (ref 4.0–10.5)
nRBC: 0 % (ref 0.0–0.2)

## 2022-09-15 MED ORDER — PREDNISONE 10 MG (21) PO TBPK: ORAL_TABLET | ORAL | 0 refills | Status: DC

## 2022-09-15 MED ORDER — DIPHENHYDRAMINE HCL 50 MG/ML IJ SOLN
50.0000 mg | Freq: Once | INTRAMUSCULAR | Status: AC
  Administered 2022-09-15: 50 mg via INTRAVENOUS
  Filled 2022-09-15: qty 1

## 2022-09-15 MED ORDER — FAMOTIDINE IN NACL 20-0.9 MG/50ML-% IV SOLN
20.0000 mg | Freq: Once | INTRAVENOUS | Status: AC
  Administered 2022-09-15: 20 mg via INTRAVENOUS
  Filled 2022-09-15: qty 50

## 2022-09-15 MED ORDER — METHYLPREDNISOLONE SODIUM SUCC 125 MG IJ SOLR
125.0000 mg | Freq: Once | INTRAMUSCULAR | Status: AC
  Administered 2022-09-15: 125 mg via INTRAVENOUS
  Filled 2022-09-15: qty 2

## 2022-09-15 NOTE — ED Triage Notes (Signed)
Pt presents via EMS with complaints of an allergic reaction to hair dye last night. Pt stated that he started having swelling this AM. Endorses itching. Received 50mg  of Benadryl PTA from EMS.  Air way patent -respirations equal and unlabored. Denies CP or SOB.

## 2022-09-15 NOTE — Discharge Instructions (Signed)
Go to your local grocery store and buy 2% hydrogen peroxide. Soak scalp in hydrogen peroxide and then shampoo here. Apply olive oil with a tablespoon of lime juice and let solution sit on here overnight.

## 2022-09-15 NOTE — ED Provider Notes (Signed)
[facility] Provider Note  Patient Contact: 9:08 PM (approximate)   History   Allergic Reaction   HPI  Gregg Leon is a 56 y.o. 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  Patient put hair dye on his hair 4 days ago and has noticed flaking of the scalp and facial edema, particularly of the periocular skin.  Swelling has become progressive.  No vomiting, diarrhea, cough or syncope.  No similar reactions in the past.      Physical Exam   Triage Vital Signs: ED Triage Vitals  Enc Vitals Group     BP 09/15/22 2043 (!) 151/88     Pulse Rate 09/15/22 2043 66     Resp 09/15/22 2043 16     Temp 09/15/22 2043 98 F (36.7 C)     Temp Source 09/15/22 2043 Oral     SpO2 09/15/22 2043 98 %     Weight 09/15/22 2039 167 lb 8.8 oz (76 kg)     Height 09/15/22 2039 5\' 7"  (1.702 m)     Head Circumference --      Peak Flow --      Pain Score 09/15/22 2041 0     Pain Loc --      Pain Edu? --      Excl. in [place]? --     Most recent vital signs: Vitals:   09/15/22 2043  BP: (!) 151/88  Pulse: 66  Resp: 16  Temp: 98 F (36.7 C)  SpO2: 98%     General: Alert and in no acute distress. Eyes:  PERRL. EOMI. Head: No acute traumatic findings ENT:      Nose: No congestion/rhinnorhea.      Mouth/Throat: Mucous membranes are moist. Neck: No stridor. No cervical spine tenderness to palpation. Cardiovascular:  Good peripheral perfusion Respiratory: Normal respiratory effort without tachypnea or retractions. Lungs CTAB. Good air entry to the bases with no decreased or absent breath sounds. Gastrointestinal: Bowel sounds 4 quadrants. Soft and nontender to palpation. No guarding or rigidity. No palpable masses. No distention. No CVA tenderness. Musculoskeletal: Full range of motion to all extremities.  Neurologic:  No gross focal neurologic deficits are appreciated.  Skin: Patient has facial swelling and periorbital  edema.    ED Results / Procedures / Treatments   Labs (all labs ordered are listed, but only abnormal results are displayed) Labs Reviewed  COMPREHENSIVE METABOLIC PANEL - Abnormal; Notable for the following components:      Result Value   Chloride 112 (*)    CO2 20 (*)    Glucose, Bld 114 (*)    Calcium 8.4 (*)    ALT 52 (*)    All other components within normal limits  CBC WITH DIFFERENTIAL/PLATELET       PROCEDURES:  Critical Care performed: No  Procedures   MEDICATIONS ORDERED IN ED: Medications  methylPREDNISolone sodium succinate (SOLU-MEDROL) 125 mg/2 mL injection 125 mg (125 mg Intravenous Given 09/15/22 2120)  famotidine (PEPCID) IVPB 20 mg premix (20 mg Intravenous New Bag/Given 09/15/22 2120)  diphenhydrAMINE (BENADRYL) injection 50 mg (50 mg Intravenous Given 09/15/22 2120)     IMPRESSION / MDM / ASSESSMENT AND PLAN / ED COURSE  I reviewed the triage vital signs and the nursing notes.                              Assessment and plan:  Allergic reaction:  56 year old male presents to the emergency department with facial swelling and rash of scalp after application of hair dye.  Patient was hypertensive at triage but vital signs were otherwise reassuring.  Patient was alert, active and nontoxic-appearing.  Will give Solu-Medrol, Benadryl and Pepcid with plans to treat patient with prednisone.  I also will recommend neutralizing agents, 2% hydrogen peroxide and application of solution of all of oil and lime juice.      FINAL CLINICAL IMPRESSION(S) / ED DIAGNOSES   Final diagnoses:  Allergic reaction, initial encounter     Rx / DC Orders   ED Discharge Orders          Ordered    predniSONE (STERAPRED UNI-PAK 21 TAB) 10 MG (21) TBPK tablet        09/15/22 2256             Note:  This document was prepared using Dragon voice recognition software and may include unintentional dictation errors.   Pia Mau Sayner, PA-C 09/15/22 3220     Phineas Semen, MD 09/15/22 [phone]

## 2022-09-16 ENCOUNTER — Telehealth: Payer: Self-pay | Admitting: Emergency Medicine

## 2022-09-16 MED ORDER — PREDNISONE 10 MG (21) PO TBPK: ORAL_TABLET | ORAL | 0 refills | Status: AC

## 2022-09-16 NOTE — Telephone Encounter (Signed)
Change pharmacy
